# Patient Record
Sex: Female | Born: 1950 | Race: White | Hispanic: No | Smoking: Never smoker
Health system: Southern US, Community
[De-identification: ages and names within clinical notes are randomized; demographics above are authoritative.]

## PROBLEM LIST (undated history)

## (undated) DIAGNOSIS — E785 Hyperlipidemia, unspecified: Secondary | ICD-10-CM

## (undated) DIAGNOSIS — G4700 Insomnia, unspecified: Secondary | ICD-10-CM

## (undated) DIAGNOSIS — K5900 Constipation, unspecified: Secondary | ICD-10-CM

## (undated) DIAGNOSIS — H269 Unspecified cataract: Secondary | ICD-10-CM

## (undated) DIAGNOSIS — R32 Unspecified urinary incontinence: Secondary | ICD-10-CM

## (undated) DIAGNOSIS — R5383 Other fatigue: Secondary | ICD-10-CM

## (undated) DIAGNOSIS — D649 Anemia, unspecified: Secondary | ICD-10-CM

## (undated) DIAGNOSIS — R61 Generalized hyperhidrosis: Secondary | ICD-10-CM

## (undated) DIAGNOSIS — F419 Anxiety disorder, unspecified: Secondary | ICD-10-CM

## (undated) DIAGNOSIS — T7840XA Allergy, unspecified, initial encounter: Secondary | ICD-10-CM

## (undated) HISTORY — DX: Insomnia, unspecified: G47.00

## (undated) HISTORY — DX: Other fatigue: R53.83

## (undated) HISTORY — DX: Anemia, unspecified: D64.9

## (undated) HISTORY — DX: Constipation, unspecified: K59.00

## (undated) HISTORY — DX: Unspecified cataract: H26.9

## (undated) HISTORY — PX: MOUTH SURGERY: SHX715

## (undated) HISTORY — DX: Unspecified urinary incontinence: R32

## (undated) HISTORY — DX: Hyperlipidemia, unspecified: E78.5

## (undated) HISTORY — DX: Generalized hyperhidrosis: R61

## (undated) HISTORY — DX: Anxiety disorder, unspecified: F41.9

## (undated) HISTORY — DX: Allergy, unspecified, initial encounter: T78.40XA

## (undated) HISTORY — PX: TUBAL LIGATION: SHX77

---

## 1999-01-27 ENCOUNTER — Other Ambulatory Visit: Admission: RE | Admit: 1999-01-27 | Discharge: 1999-01-27 | Payer: Self-pay | Admitting: *Deleted

## 2000-02-09 ENCOUNTER — Other Ambulatory Visit: Admission: RE | Admit: 2000-02-09 | Discharge: 2000-02-09 | Payer: Self-pay | Admitting: Obstetrics and Gynecology

## 2001-02-11 ENCOUNTER — Other Ambulatory Visit: Admission: RE | Admit: 2001-02-11 | Discharge: 2001-02-11 | Payer: Self-pay | Admitting: *Deleted

## 2007-12-16 ENCOUNTER — Ambulatory Visit (HOSPITAL_COMMUNITY): Admission: RE | Admit: 2007-12-16 | Discharge: 2007-12-16 | Payer: Self-pay | Admitting: Family Medicine

## 2008-01-05 ENCOUNTER — Ambulatory Visit (HOSPITAL_COMMUNITY): Admission: RE | Admit: 2008-01-05 | Discharge: 2008-01-05 | Payer: Self-pay | Admitting: Family Medicine

## 2009-05-13 ENCOUNTER — Ambulatory Visit (HOSPITAL_COMMUNITY): Admission: RE | Admit: 2009-05-13 | Discharge: 2009-05-13 | Payer: Self-pay | Admitting: Family Medicine

## 2010-06-15 ENCOUNTER — Ambulatory Visit (HOSPITAL_COMMUNITY): Admission: RE | Admit: 2010-06-15 | Discharge: 2010-06-15 | Payer: Self-pay | Admitting: Family Medicine

## 2010-08-17 ENCOUNTER — Ambulatory Visit: Payer: Self-pay | Admitting: Cardiology

## 2010-08-17 DIAGNOSIS — R002 Palpitations: Secondary | ICD-10-CM | POA: Insufficient documentation

## 2010-08-17 DIAGNOSIS — F411 Generalized anxiety disorder: Secondary | ICD-10-CM | POA: Insufficient documentation

## 2010-08-18 ENCOUNTER — Encounter: Payer: Self-pay | Admitting: Cardiology

## 2010-11-21 NOTE — Letter (Signed)
Summary: [facility] MEDICAL RECORDS  [facility]   Imported By: Faythe Ghee 08/18/2010 12:46:19  _____________________________________________________________________  External Attachment:    Type:   Image     Comment:   External Document

## 2010-11-21 NOTE — Assessment & Plan Note (Signed)
Summary: ***NP6 PALPITATIONS   Visit Type:  Initial Consult Primary Provider:  Dr. Assunta Found   History of Present Illness: 60 year old woman referred for cardiology consultation. She presents with a history of long-standing anxiety, reported difficulty handling stressful situations including being "overwhelmed" and also in loud or confusing situations. She denies any long-standing history of palpitations, but describes 3 separate episodes that occurred over the weekend 2 weeks ago. She reports a very stressful, busy time on that Saturday and Sunday, with episodes that began as sweating, followed by nervousness, and then sense of rapid heartbeat. Gradually as she calmed down the symptoms would improve over the span of an hour. She had no dizziness or syncope, no chest pain or shortness of breath. She stated retrospect that she thinks she may have had a "panic attack."  Otherwise with exertion she has no major functional limitation. She reports NYHA class I dyspnea on exertion, no exertional chest pain, no palpitations with exertion, no syncope with exertion.  ECG from today is reviewed below.  Preventive Screening-Counseling & Management  Alcohol-Tobacco     Smoking Status: never  Caffeine-Diet-Exercise     Does Patient Exercise: yes  Current Medications (verified): 1)  Ambien 10 Mg Tabs (Zolpidem Tartrate) .... Take 1 Tab At Bedtime 2)  Zocor 20 Mg Tabs (Simvastatin) .... Take 1 Tab Daily  Allergies (verified): No Known Drug Allergies  Comments:  Nurse/Medical Assistant: patient reviewed meds from Dr.Halls visit stated that meds are correct  Past History:  Family History: Last updated: 08/17/2010 Siblings:4 sisters alive has hypertension Father: hypertension, diabetes, stroke Mother: heart disease  Social History: Last updated: 08/17/2010 Tobacco Use - No Alcohol Use - no Full Time Divorced  2 children Regular Exercise - yes  Past Medical  History: Hyperlipidemia Anxiety  Past Surgical History: Unremarkable  Family History: Siblings:4 sisters alive has hypertension Father: hypertension, diabetes, stroke Mother: heart disease  Social History: Tobacco Use - No Alcohol Use - no Full Time Divorced  2 children Regular Exercise - yes Smoking Status:  never Does Patient Exercise:  yes  Review of Systems  The patient denies anorexia, fever, weight loss, chest pain, syncope, dyspnea on exertion, peripheral edema, prolonged cough, headaches, hemoptysis, abdominal pain, melena, and hematochezia.         Otherwise reviewed and negative.  Vital Signs:  Patient profile:   59 year old female Height:      66 inches Weight:      149 pounds BMI:     24 .14 Pulse rate:   84 / minute BP sitting:   118 / 74  (right arm)  Vitals Entered By: Dreama Saa, CNA (August 17, 2010 8:49 AM)  Physical Exam  Additional Exam:  Normally nourished appearing woman in no acute distress. HEENT: Conjunctiva and lids normal, oropharynx with moist mucosa. Neck: Supple, no elevated JVP or carotid bruits, no thyromegaly. Lungs: Clear to auscultation, nonlabored. Cardiac: Regular rate and rhythm, no pathologic systolic murmur, no click or S3 gallop. Abdomen: Soft, nontender, bowel sounds present. Skin: Warm and dry. Musculoskeletal: No gross deformities. Extremities: No pitting edema, distal pulses full. Neuropsychiatric: Alert and oriented x3, affect grossly appropriate.   EKG  Procedure date:  08/17/2010  Findings:      Normal sinus rhythm at 77 beats per minute, normal intervals.  Impression & Recommendations:  Problem # 1:  PALPITATIONS (ICD-785.1)  Not recurrent over the last 2 weeks, not associated with dizziness or syncope. Otherwise no exertional symptoms. ECG is normal. It may  well be that the symptoms are related to anxiety, in light of her history. The possibility of a transient cardiac dysrhythmia is also to be  considered, however the probability of a major cardiac abnormality is fairly low. We discussed either continued observation with plan to test further if symptoms progress, versus proceeding on with a cardiac monitor and echocardiogram now. She is most comfortable with observation. We discussed avoiding stimulants, also some stress management. She can continue to follow with Dr. Phillips Odor. We can see her back if symptoms escalate.  Problem # 2:  ANXIETY (ICD-300.00)  Seems to be a problem throughout the patient's life based on her description. She states that she has seen a behavioral health specialist in the past and was on a number of different medications, none of which she tolerated by report. It may be worth referring her to see a psychologist for cognitive therapy. Will defer to Dr. Phillips Odor.  Patient Instructions: 1)  Your physician recommends that you schedule a follow-up appointment in: as needed  2)  Your physician recommends that you continue on your current medications as directed. Please refer to the Current Medication list given to you today.

## 2011-10-17 ENCOUNTER — Encounter: Payer: Self-pay | Admitting: Cardiology

## 2013-08-06 ENCOUNTER — Other Ambulatory Visit (HOSPITAL_COMMUNITY): Payer: Self-pay | Admitting: Family Medicine

## 2013-08-06 DIAGNOSIS — Z139 Encounter for screening, unspecified: Secondary | ICD-10-CM

## 2013-08-12 ENCOUNTER — Other Ambulatory Visit (HOSPITAL_COMMUNITY): Payer: Self-pay

## 2015-05-23 ENCOUNTER — Encounter: Payer: Self-pay | Admitting: Gastroenterology

## 2015-07-22 ENCOUNTER — Ambulatory Visit (AMBULATORY_SURGERY_CENTER): Payer: Self-pay

## 2015-07-22 VITALS — Ht 65.0 in | Wt 165.6 lb

## 2015-07-22 DIAGNOSIS — Z8371 Family history of colonic polyps: Secondary | ICD-10-CM

## 2015-07-22 DIAGNOSIS — Z83719 Family history of colon polyps, unspecified: Secondary | ICD-10-CM

## 2015-07-22 MED ORDER — SUPREP BOWEL PREP KIT 17.5-3.13-1.6 GM/177ML PO SOLN: 1.0000 | Freq: Once | ORAL | Status: DC

## 2015-07-22 NOTE — Progress Notes (Signed)
No allergies to eggs or soy No diet/weight loss meds No home oxygen No past problems with anesthesia Very sensitive to anesthesia in dentist office  No internet

## 2015-07-25 NOTE — Patient Instructions (Signed)
Your procedure is scheduled on: 08/02/2015  Report to [facility] at  81  AM.  Call this number if you have problems the morning of surgery: [phone]   Do not eat food or drink liquids :After Midnight.      Take these medicines the morning of surgery with A SIP OF WATER: none   Do not wear jewelry, make-up or nail polish.  Do not wear lotions, powders, or perfumes. You may wear deodorant.  Do not shave 48 hours prior to surgery.  Do not bring valuables to the hospital.  Contacts, dentures or bridgework may not be worn into surgery.  Leave suitcase in the car. After surgery it may be brought to your room.  For patients admitted to the hospital, checkout time is 11:00 AM the day of discharge.   Patients discharged the day of surgery will not be allowed to drive home.  :     Please read over the following fact sheets that you were given: Coughing and Deep Breathing, Surgical Site Infection Prevention, Anesthesia Post-op Instructions and Care and Recovery After Surgery    Cataract A cataract is a clouding of the lens of the eye. When a lens becomes cloudy, vision is reduced based on the degree and nature of the clouding. Many cataracts reduce vision to some degree. Some cataracts make people more near-sighted as they develop. Other cataracts increase glare. Cataracts that are ignored and become worse can sometimes look white. The white color can be seen through the pupil. CAUSES   Aging. However, cataracts may occur at any age, even in newborns.   Certain drugs.   Trauma to the eye.   Certain diseases such as diabetes.   Specific eye diseases such as chronic inflammation inside the eye or a sudden attack of a rare form of glaucoma.   Inherited or acquired medical problems.  SYMPTOMS   Gradual, progressive drop in vision in the affected eye.   Severe, rapid visual loss. This most often happens when trauma is the cause.  DIAGNOSIS  To detect a cataract, an eye doctor examines  the lens. Cataracts are best diagnosed with an exam of the eyes with the pupils enlarged (dilated) by drops.  TREATMENT  For an early cataract, vision may improve by using different eyeglasses or stronger lighting. If that does not help your vision, surgery is the only effective treatment. A cataract needs to be surgically removed when vision loss interferes with your everyday activities, such as driving, reading, or watching TV. A cataract may also have to be removed if it prevents examination or treatment of another eye problem. Surgery removes the cloudy lens and usually replaces it with a substitute lens (intraocular lens, IOL).  At a time when both you and your doctor agree, the cataract will be surgically removed. If you have cataracts in both eyes, only one is usually removed at a time. This allows the operated eye to heal and be out of danger from any possible problems after surgery (such as infection or poor wound healing). In rare cases, a cataract may be doing damage to your eye. In these cases, your caregiver may advise surgical removal right away. The vast majority of people who have cataract surgery have better vision afterward. HOME CARE INSTRUCTIONS  If you are not planning surgery, you may be asked to do the following:  Use different eyeglasses.   Use stronger or brighter lighting.   Ask your eye doctor about reducing your medicine dose or  changing medicines if it is thought that a medicine caused your cataract. Changing medicines does not make the cataract go away on its own.   Become familiar with your surroundings. Poor vision can lead to injury. Avoid bumping into things on the affected side. You are at a higher risk for tripping or falling.   Exercise extreme care when driving or operating machinery.   Wear sunglasses if you are sensitive to bright light or experiencing problems with glare.  SEEK IMMEDIATE MEDICAL CARE IF:   You have a worsening or sudden vision loss.    You notice redness, swelling, or increasing pain in the eye.   You have a fever.  Document Released: 10/08/2005 Document Revised: 09/27/2011 Document Reviewed: 06/01/2011 [facility] Patient Information 2012 [place].PATIENT INSTRUCTIONS POST-ANESTHESIA  IMMEDIATELY FOLLOWING SURGERY:  Do not drive or operate machinery for the first twenty four hours after surgery.  Do not make any important decisions for twenty four hours after surgery or while taking narcotic pain medications or sedatives.  If you develop intractable nausea and vomiting or a severe headache please notify your doctor immediately.  FOLLOW-UP:  Please make an appointment with your surgeon as instructed. You do not need to follow up with anesthesia unless specifically instructed to do so.  WOUND CARE INSTRUCTIONS (if applicable):  Keep a dry clean dressing on the anesthesia/puncture wound site if there is drainage.  Once the wound has quit draining you may leave it open to air.  Generally you should leave the bandage intact for twenty four hours unless there is drainage.  If the epidural site drains for more than 36-48 hours please call the anesthesia department.  QUESTIONS?:  Please feel free to call your physician or the hospital operator if you have any questions, and they will be happy to assist you.

## 2015-07-26 ENCOUNTER — Encounter (HOSPITAL_COMMUNITY)
Admission: RE | Admit: 2015-07-26 | Discharge: 2015-07-26 | Disposition: A | Discharge status: Home / Self Care | Payer: 59 | Source: Ambulatory Visit | Attending: Ophthalmology | Admitting: Ophthalmology

## 2015-07-26 ENCOUNTER — Encounter (HOSPITAL_COMMUNITY): Payer: Self-pay

## 2015-07-26 ENCOUNTER — Other Ambulatory Visit: Payer: Self-pay

## 2015-07-26 DIAGNOSIS — Z01818 Encounter for other preprocedural examination: Secondary | ICD-10-CM | POA: Insufficient documentation

## 2015-07-26 DIAGNOSIS — H2512 Age-related nuclear cataract, left eye: Secondary | ICD-10-CM | POA: Insufficient documentation

## 2015-07-26 LAB — BASIC METABOLIC PANEL
Anion gap: 8 (ref 5–15)
BUN: 13 mg/dL (ref 6–20)
CO2: 26 mmol/L (ref 22–32)
Calcium: 8.7 mg/dL — ABNORMAL LOW (ref 8.9–10.3)
Chloride: 107 mmol/L (ref 101–111)
Creatinine, Ser: 0.82 mg/dL (ref 0.44–1.00)
GFR calc Af Amer: >60 mL/min (ref >60)
GFR calc non Af Amer: >60 mL/min (ref >60)
Glucose, Bld: 88 mg/dL (ref 65–99)
Potassium: 4.2 mmol/L (ref 3.5–5.1)
Sodium: 141 mmol/L (ref 135–145)

## 2015-07-26 LAB — CBC WITH DIFFERENTIAL/PLATELET
Basophils Absolute: 0.1 10*3/uL (ref 0.0–0.1)
Basophils Relative: 1 %
Eosinophils Absolute: 0.1 10*3/uL (ref 0.0–0.7)
Eosinophils Relative: 1 %
HCT: 41.3 % (ref 36.0–46.0)
Hemoglobin: 14 g/dL (ref 12.0–15.0)
Lymphocytes Relative: 32 %
Lymphs Abs: 2.1 10*3/uL (ref 0.7–4.0)
MCH: 30 pg (ref 26.0–34.0)
MCHC: 33.9 g/dL (ref 30.0–36.0)
MCV: 88.6 fL (ref 78.0–100.0)
Monocytes Absolute: 0.6 10*3/uL (ref 0.1–1.0)
Monocytes Relative: 9 %
Neutro Abs: 3.8 10*3/uL (ref 1.7–7.7)
Neutrophils Relative %: 57 %
Platelets: 273 10*3/uL (ref 150–400)
RBC: 4.66 MIL/uL (ref 3.87–5.11)
RDW: 13.6 % (ref 11.5–15.5)
WBC: 6.6 10*3/uL (ref 4.0–10.5)

## 2015-07-26 NOTE — Pre-Procedure Instructions (Signed)
Patient given inforamtion to sign up for my chart at home. 

## 2015-07-28 ENCOUNTER — Other Ambulatory Visit: Payer: Self-pay | Admitting: Obstetrics and Gynecology

## 2015-07-28 DIAGNOSIS — R928 Other abnormal and inconclusive findings on diagnostic imaging of breast: Secondary | ICD-10-CM

## 2015-08-01 MED ORDER — KETOROLAC TROMETHAMINE 0.5 % OP SOLN
OPHTHALMIC | Status: AC
  Filled 2015-08-01: qty 5

## 2015-08-01 MED ORDER — CYCLOPENTOLATE-PHENYLEPHRINE OP SOLN OPTIME - NO CHARGE
OPHTHALMIC | Status: AC
  Filled 2015-08-01: qty 2

## 2015-08-01 MED ORDER — PHENYLEPHRINE HCL 2.5 % OP SOLN
OPHTHALMIC | Status: AC
  Filled 2015-08-01: qty 15

## 2015-08-01 MED ORDER — TETRACAINE HCL 0.5 % OP SOLN
OPHTHALMIC | Status: AC
  Filled 2015-08-01: qty 2

## 2015-08-02 ENCOUNTER — Ambulatory Visit (HOSPITAL_COMMUNITY): Payer: 59 | Admitting: Anesthesiology

## 2015-08-02 ENCOUNTER — Encounter (HOSPITAL_COMMUNITY)
Admission: RE | Disposition: A | Discharge status: Home / Self Care | Payer: Self-pay | Source: Ambulatory Visit | Attending: Ophthalmology

## 2015-08-02 ENCOUNTER — Ambulatory Visit (HOSPITAL_COMMUNITY)
Admission: RE | Admit: 2015-08-02 | Discharge: 2015-08-02 | Disposition: A | Discharge status: Home / Self Care | Payer: 59 | Source: Ambulatory Visit | Attending: Ophthalmology | Admitting: Ophthalmology

## 2015-08-02 ENCOUNTER — Encounter (HOSPITAL_COMMUNITY): Payer: Self-pay

## 2015-08-02 DIAGNOSIS — H2512 Age-related nuclear cataract, left eye: Secondary | ICD-10-CM | POA: Insufficient documentation

## 2015-08-02 DIAGNOSIS — H25012 Cortical age-related cataract, left eye: Secondary | ICD-10-CM | POA: Insufficient documentation

## 2015-08-02 HISTORY — PX: CATARACT EXTRACTION W/PHACO: SHX586

## 2015-08-02 SURGERY — PHACOEMULSIFICATION, CATARACT, WITH IOL INSERTION
Anesthesia: Monitor Anesthesia Care | Site: Eye | Laterality: Left

## 2015-08-02 MED ORDER — KETOROLAC TROMETHAMINE 0.5 % OP SOLN
1.0000 [drp] | OPHTHALMIC | Status: AC
  Administered 2015-08-02 (×3): 1 [drp] via OPHTHALMIC

## 2015-08-02 MED ORDER — FENTANYL CITRATE (PF) 100 MCG/2ML IJ SOLN
25.0000 ug | INTRAMUSCULAR | Status: AC
Start: 2015-08-02 — End: 2015-08-02
  Administered 2015-08-02 (×2): 25 ug via INTRAVENOUS

## 2015-08-02 MED ORDER — MIDAZOLAM HCL 2 MG/2ML IJ SOLN
1.0000 mg | INTRAMUSCULAR | Status: DC | PRN
  Administered 2015-08-02: 2 mg via INTRAVENOUS

## 2015-08-02 MED ORDER — LACTATED RINGERS IV SOLN
INTRAVENOUS | Status: DC
  Administered 2015-08-02: 09:00:00 via INTRAVENOUS

## 2015-08-02 MED ORDER — EPINEPHRINE HCL 1 MG/ML IJ SOLN
INTRAMUSCULAR | Status: AC
  Filled 2015-08-02: qty 1

## 2015-08-02 MED ORDER — TETRACAINE 0.5 % OP SOLN OPTIME - NO CHARGE
OPHTHALMIC | Status: DC | PRN
  Administered 2015-08-02: 2 [drp] via OPHTHALMIC

## 2015-08-02 MED ORDER — TETRACAINE HCL 0.5 % OP SOLN
1.0000 [drp] | OPHTHALMIC | Status: AC
Start: 2015-08-02 — End: 2015-08-02
  Administered 2015-08-02 (×3): 1 [drp] via OPHTHALMIC

## 2015-08-02 MED ORDER — PHENYLEPHRINE HCL 2.5 % OP SOLN
1.0000 [drp] | OPHTHALMIC | Status: AC
  Administered 2015-08-02 (×3): 1 [drp] via OPHTHALMIC

## 2015-08-02 MED ORDER — PROVISC 10 MG/ML IO SOLN
INTRAOCULAR | Status: DC | PRN
  Administered 2015-08-02: 0.85 mL via INTRAOCULAR

## 2015-08-02 MED ORDER — CYCLOPENTOLATE-PHENYLEPHRINE 0.2-1 % OP SOLN
1.0000 [drp] | OPHTHALMIC | Status: AC
  Administered 2015-08-02 (×3): 1 [drp] via OPHTHALMIC

## 2015-08-02 MED ORDER — MIDAZOLAM HCL 2 MG/2ML IJ SOLN
INTRAMUSCULAR | Status: AC
Start: 2015-08-02 — End: 2015-08-02
  Filled 2015-08-02: qty 2

## 2015-08-02 MED ORDER — FENTANYL CITRATE (PF) 100 MCG/2ML IJ SOLN
INTRAMUSCULAR | Status: AC
  Filled 2015-08-02: qty 2

## 2015-08-02 MED ORDER — BSS IO SOLN
INTRAOCULAR | Status: DC | PRN
  Administered 2015-08-02: 500 mL

## 2015-08-02 MED ORDER — BSS IO SOLN
INTRAOCULAR | Status: DC | PRN
  Administered 2015-08-02: 15 mL

## 2015-08-02 SURGICAL SUPPLY — 11 items
CLOTH BEACON ORANGE TIMEOUT ST (SAFETY) ×3 IMPLANT
EYE SHIELD UNIVERSAL CLEAR (GAUZE/BANDAGES/DRESSINGS) ×3 IMPLANT
GLOVE BIO SURGEON STRL SZ 6.5 (GLOVE) ×2 IMPLANT
GLOVE BIO SURGEONS STRL SZ 6.5 (GLOVE) ×1
GLOVE EXAM NITRILE MD LF STRL (GLOVE) ×3 IMPLANT
LENS ALC ACRYL/TECN (Ophthalmic Related) ×3 IMPLANT
PAD ARMBOARD 7.5X6 YLW CONV (MISCELLANEOUS) ×3 IMPLANT
RING MALYGIN (MISCELLANEOUS) ×3 IMPLANT
TAPE SURG TRANSPORE 1 IN (GAUZE/BANDAGES/DRESSINGS) ×1 IMPLANT
TAPE SURGICAL TRANSPORE 1 IN (GAUZE/BANDAGES/DRESSINGS) ×2
WATER STERILE IRR 250ML POUR (IV SOLUTION) ×3 IMPLANT

## 2015-08-02 NOTE — Transfer of Care (Signed)
Immediate Anesthesia Transfer of Care Note  Patient: Yesenia Sanchez  Procedure(s) Performed: Procedure(s) (LRB): CATARACT EXTRACTION PHACO AND INTRAOCULAR LENS PLACEMENT LEFT EYE (Left)  Patient Location: Shortstay  Anesthesia Type: MAC  Level of Consciousness: awake  Airway & Oxygen Therapy: Patient Spontanous Breathing   Post-op Assessment: Report given to PACU RN, Post -op Vital signs reviewed and stable and Patient moving all extremities  Post vital signs: Reviewed and stable  Complications: No apparent anesthesia complications

## 2015-08-02 NOTE — Anesthesia Preprocedure Evaluation (Signed)
Anesthesia Evaluation  Patient identified by MRN, date of birth, ID band Patient awake    Reviewed: Allergy & Precautions, NPO status , Patient's Chart, lab work & pertinent test results  Airway Mallampati: II  TM Distance: >3 FB     Dental  (+) Teeth Intact   Pulmonary neg pulmonary ROS,    breath sounds clear to auscultation       Cardiovascular negative cardio ROS   Rhythm:Regular Rate:Normal     Neuro/Psych Anxiety    GI/Hepatic negative GI ROS,   Endo/Other    Renal/GU      Musculoskeletal   Abdominal   Peds  Hematology   Anesthesia Other Findings   Reproductive/Obstetrics                             Anesthesia Physical Anesthesia Plan  ASA: I  Anesthesia Plan: MAC   Post-op Pain Management:    Induction: Intravenous  Airway Management Planned: Nasal Cannula  Additional Equipment:   Intra-op Plan:   Post-operative Plan:   Informed Consent: I have reviewed the patients History and Physical, chart, labs and discussed the procedure including the risks, benefits and alternatives for the proposed anesthesia with the patient or authorized representative who has indicated his/her understanding and acceptance.     Plan Discussed with:   Anesthesia Plan Comments:         Anesthesia Quick Evaluation

## 2015-08-02 NOTE — Discharge Instructions (Signed)
Yesenia Sanchez  08/02/2015           [facility] Instructions [place] [address]-[place]      1. Avoid closing eyes tightly. One often closes the eye tightly when laughing, talking, sneezing, coughing or if they feel irritated. At these times, you should be careful not to close your eyes tightly.  2. Instill eye drops as instructed. To instill drops in your eye, open it, look up and have someone gently pull the lower lid down and instill a couple of drops inside the lower lid.  3. Do not touch upper lid.  4. Take Advil or Tylenol for pain.  5. You may use either eye for near work, such as reading or sewing and you may watch television.  6. You may have your hair done at the beauty parlor at any time.  7. Wear dark glasses with or without your own glasses if you are in bright light.  8. Call our office at [phone] or [phone] if you have sharp pain in your eye or unusual symptoms.  9. Do not be concerned because vision in the operative eye is not good. It will not be good, no matter how successful the operation, until you get a special lens for it. Your old glasses will not be suited to the new eye that was operated on and you will not be ready for a new lens for about a month.  10. Follow up at the [facility] office.    I have received a copy of the above instructions and will follow them.      FOLLOW UP WITH DR. SHAPIRO TODAY BETWEEN 2-3 PM

## 2015-08-02 NOTE — Op Note (Signed)
Patient brought to the operating room and prepped and draped in the usual manner.  Lid speculum inserted in left eye.  Stab incision made at the twelve o'clock position.  Provisc instilled in the anterior chamber.   A 2.4 mm. Stab incision was made temporally. Due to a small pupil, a Malugyn Ring was inserted. An anterior capsulotomy was done with a bent 25 gauge needle.  The nucleus was hydrodissected.  The Phaco tip was inserted in the anterior chamber and the nucleus was emulsified.  CDE was 4.01.  The cortical material was then removed with the I and A tip.  Posterior capsule was the polished.  The anterior chamber was deepened with Provisc.  A 22.0 Diopter Alcon [id number] IOL was then inserted in the capsular bag.  The Malugyn Ring was removed. Provisc was then removed with the I and A tip.  The wound was then hydrated.  Patient sent to the Recovery Room in good condition with follow up in my office.  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

## 2015-08-02 NOTE — Anesthesia Postprocedure Evaluation (Signed)
  Anesthesia Post-op Note  Patient: Yesenia Sanchez  Procedure(s) Performed: Procedure(s) (LRB): CATARACT EXTRACTION PHACO AND INTRAOCULAR LENS PLACEMENT LEFT EYE (Left)  Patient Location:  Short Stay  Anesthesia Type: MAC  Level of Consciousness: awake  Airway and Oxygen Therapy: Patient Spontanous Breathing  Post-op Pain: none  Post-op Assessment: Post-op Vital signs reviewed, Patient's Cardiovascular Status Stable, Respiratory Function Stable, Patent Airway, No signs of Nausea or vomiting and Pain level controlled  Post-op Vital Signs: Reviewed and stable  Complications: No apparent anesthesia complications

## 2015-08-02 NOTE — H&P (Signed)
The patient was re examined and there is no change in the patients condition since the original H and P. 

## 2015-08-02 NOTE — Anesthesia Procedure Notes (Signed)
Procedure Name: MAC Date/Time: 08/02/2015 9:28 AM Performed by: Vista Deck Pre-anesthesia Checklist: Patient identified, Emergency Drugs available, Suction available, Timeout performed and Patient being monitored Patient Re-evaluated:Patient Re-evaluated prior to inductionOxygen Delivery Method: Nasal Cannula

## 2015-08-04 ENCOUNTER — Encounter (HOSPITAL_COMMUNITY): Payer: Self-pay | Admitting: Ophthalmology

## 2015-08-04 ENCOUNTER — Ambulatory Visit (AMBULATORY_SURGERY_CENTER): Payer: 59 | Admitting: Gastroenterology

## 2015-08-04 VITALS — BP 130/60 | HR 66 | Temp 98.0°F | Resp 18 | Ht 65.0 in | Wt 165.0 lb

## 2015-08-04 DIAGNOSIS — D120 Benign neoplasm of cecum: Secondary | ICD-10-CM

## 2015-08-04 DIAGNOSIS — K635 Polyp of colon: Secondary | ICD-10-CM | POA: Diagnosis not present

## 2015-08-04 DIAGNOSIS — Z1211 Encounter for screening for malignant neoplasm of colon: Secondary | ICD-10-CM

## 2015-08-04 DIAGNOSIS — D125 Benign neoplasm of sigmoid colon: Secondary | ICD-10-CM

## 2015-08-04 DIAGNOSIS — Z8371 Family history of colonic polyps: Secondary | ICD-10-CM

## 2015-08-04 MED ORDER — SODIUM CHLORIDE 0.9 % IV SOLN: 500.0000 mL | INTRAVENOUS | Status: DC

## 2015-08-04 NOTE — Patient Instructions (Signed)
YOU HAD AN ENDOSCOPIC PROCEDURE TODAY AT THE [facility]:   Refer to the procedure report that was given to you for any specific questions about what was found during the examination.  If the procedure report does not answer your questions, please call your gastroenterologist to clarify.  If you requested that your care partner not be given the details of your procedure findings, then the procedure report has been included in a sealed envelope for you to review at your convenience later.  YOU SHOULD EXPECT: Some feelings of bloating in the abdomen. Passage of more gas than usual.  Walking can help get rid of the air that was put into your GI tract during the procedure and reduce the bloating. If you had a lower endoscopy (such as a colonoscopy or flexible sigmoidoscopy) you may notice spotting of blood in your stool or on the toilet paper. If you underwent a bowel prep for your procedure, you may not have a normal bowel movement for a few days.  Please Note:  You might notice some irritation and congestion in your nose or some drainage.  This is from the oxygen used during your procedure.  There is no need for concern and it should clear up in a day or so.  SYMPTOMS TO REPORT IMMEDIATELY:   Following lower endoscopy (colonoscopy or flexible sigmoidoscopy):  Excessive amounts of blood in the stool  Significant tenderness or worsening of abdominal pains  Swelling of the abdomen that is new, acute  Fever of 100F or higher   For urgent or emergent issues, a gastroenterologist can be reached at any hour by calling [phone].   DIET: Your first meal following the procedure should be a small meal and then it is ok to progress to your normal diet. Heavy or fried foods are harder to digest and may make you feel nauseous or bloated.  Likewise, meals heavy in dairy and vegetables can increase bloating.  Drink plenty of fluids but you should avoid alcoholic beverages for 24  hours.  ACTIVITY:  You should plan to take it easy for the rest of today and you should NOT DRIVE or use heavy machinery until tomorrow (because of the sedation medicines used during the test).    FOLLOW UP: Our staff will call the number listed on your records the next business day following your procedure to check on you and address any questions or concerns that you may have regarding the information given to you following your procedure. If we do not reach you, we will leave a message.  However, if you are feeling well and you are not experiencing any problems, there is no need to return our call.  We will assume that you have returned to your regular daily activities without incident.  If any biopsies were taken you will be contacted by phone or by letter within the next 1-3 weeks.  Please call us at [phone] if you have not heard about the biopsies in 3 weeks.    SIGNATURES/CONFIDENTIALITY: You and/or your care partner have signed paperwork which will be entered into your electronic medical record.  These signatures attest to the fact that that the information above on your After Visit Summary has been reviewed and is understood.  Full responsibility of the confidentiality of this discharge information lies with you and/or your care-partner.   Resume medications. Information given on polyps,hemorrhoids and high fiber diet. 

## 2015-08-04 NOTE — Progress Notes (Signed)
To recovery, report to Brown, RN, VSS. 

## 2015-08-04 NOTE — Op Note (Signed)
[place]  [address] [place], [address]   COLONOSCOPY PROCEDURE REPORT  PATIENT: [name], [name]  MR#: [id number] BIRTHDATE: 10-02-1951 , 26  yrs. old GENDER: female ENDOSCOPIST: Ladene Artist, MD, [facility] REFERRED GY:FVCB Hilma Favors, M.D. PROCEDURE DATE:  08/04/2015 PROCEDURE:   Colonoscopy, screening and Colonoscopy with biopsy First Screening Colonoscopy - Avg.  risk and is 50 yrs.  old or older Yes.  Prior Negative Screening - Now for repeat screening. N/A  History of Adenoma - Now for follow-up colonoscopy & has been > or = to 3 yrs.  N/A  Polyps removed today? Yes ASA CLASS:   Class II INDICATIONS:Screening for colonic neoplasia and FH Colon Adenoma. Sister with adenomatous polyp at age 65. MEDICATIONS: Monitored anesthesia care and Propofol 200 mg IV DESCRIPTION OF PROCEDURE:   After the risks benefits and alternatives of the procedure were thoroughly explained, informed consent was obtained.  The digital rectal exam revealed no abnormalities of the rectum.   The LB [id number]  endoscope was introduced through the anus and advanced to the cecum, which was identified by both the appendix and ileocecal valve. No adverse events experienced.   The quality of the prep was good.  (Suprep was used)  The instrument was then slowly withdrawn as the colon was fully examined. Estimated blood loss is zero unless otherwise noted in this procedure report.    COLON FINDINGS: Two sessile polyps measuring 4-5 mm in size were found in the sigmoid colon and at the cecum.  Polypectomies were performed with cold forceps.  The resection was complete, the polyp tissue was completely retrieved and sent to histology.   The examination was otherwise normal.  Retroflexed views revealed internal Grade I hemorrhoids. The time to cecum = 3.5 Withdrawal time = 10.2   The scope was withdrawn and the procedure completed. COMPLICATIONS: There were no immediate  complications.  ENDOSCOPIC IMPRESSION: 1.   Two sessile polyps in the sigmoid colon and at the cecum; polypectomies performed with cold forceps 2.   Grade l internal hemorrhoids  RECOMMENDATIONS: 1.  Await pathology results 2.  Repeat colonoscopy in 5 years if polyp(s) adenomatous; otherwise 10 years  eSigned:  Ladene Artist, MD, [facility] 08/04/2015 11:52 AM

## 2015-08-04 NOTE — Progress Notes (Signed)
Called to room to assist during endoscopic procedure.  Patient ID and intended procedure confirmed with present staff. Received instructions for my participation in the procedure from the performing physician.  

## 2015-08-04 NOTE — Progress Notes (Signed)
Pt just had left cateract removed Tuesday 08-02-15.  Also pt reported having a few areas of poison oak on her upper right arm and at the bend of her left arm. maw

## 2015-08-05 ENCOUNTER — Telehealth: Payer: Self-pay

## 2015-08-05 NOTE — Telephone Encounter (Signed)
  Follow up Call-  Call back number 08/04/2015  Post procedure Call Back phone  # [phone] cell  Permission to leave phone message Yes     Patient questions:  Do you have a fever, pain , or abdominal swelling? No. Pain Score  0 *  Have you tolerated food without any problems? Yes.    Have you been able to return to your normal activities? Yes.    Do you have any questions about your discharge instructions: Diet   No. Medications  No. Follow up visit  No.  Do you have questions or concerns about your Care? No.  Actions: * If pain score is 4 or above: No action needed, pain <4.

## 2015-08-08 NOTE — Progress Notes (Signed)
Attempted to complete pre op telephone call, however patient states that she may not have right eye done at this time and asked if I would call her after her appointment with Dr Gershon Crane tomorrow between 12-230 and verify wether she will be following through with right cataract removal.

## 2015-08-09 ENCOUNTER — Encounter: Payer: Self-pay | Admitting: Gastroenterology

## 2015-08-09 ENCOUNTER — Encounter (HOSPITAL_COMMUNITY): Payer: 59

## 2015-08-09 NOTE — Pre-Procedure Instructions (Signed)
Contacted patient, who states that she is not going to proceed with second cataract surgery at this point.  [place] notified.  Office aware.

## 2015-08-12 ENCOUNTER — Ambulatory Visit
Admission: RE | Admit: 2015-08-12 | Discharge: 2015-08-12 | Disposition: A | Discharge status: Home / Self Care | Payer: 59 | Source: Ambulatory Visit | Attending: Obstetrics and Gynecology | Admitting: Obstetrics and Gynecology

## 2015-08-12 DIAGNOSIS — R928 Other abnormal and inconclusive findings on diagnostic imaging of breast: Secondary | ICD-10-CM

## 2015-08-16 ENCOUNTER — Ambulatory Visit (HOSPITAL_COMMUNITY): Admission: RE | Admit: 2015-08-16 | Payer: 59 | Source: Ambulatory Visit | Admitting: Ophthalmology

## 2015-08-16 ENCOUNTER — Encounter (HOSPITAL_COMMUNITY): Admission: RE | Payer: Self-pay | Source: Ambulatory Visit

## 2015-08-16 SURGERY — PHACOEMULSIFICATION, CATARACT, WITH IOL INSERTION
Anesthesia: Monitor Anesthesia Care | Laterality: Right

## 2016-03-14 ENCOUNTER — Ambulatory Visit (INDEPENDENT_AMBULATORY_CARE_PROVIDER_SITE_OTHER): Payer: BLUE CROSS/BLUE SHIELD | Admitting: Cardiovascular Disease

## 2016-03-14 ENCOUNTER — Encounter: Payer: Self-pay | Admitting: Cardiovascular Disease

## 2016-03-14 VITALS — BP 140/80 | HR 87 | Ht 65.0 in | Wt 172.0 lb

## 2016-03-14 DIAGNOSIS — L819 Disorder of pigmentation, unspecified: Secondary | ICD-10-CM | POA: Diagnosis not present

## 2016-03-14 DIAGNOSIS — R030 Elevated blood-pressure reading, without diagnosis of hypertension: Secondary | ICD-10-CM

## 2016-03-14 DIAGNOSIS — IMO0001 Reserved for inherently not codable concepts without codable children: Secondary | ICD-10-CM

## 2016-03-14 DIAGNOSIS — R002 Palpitations: Secondary | ICD-10-CM | POA: Diagnosis not present

## 2016-03-14 DIAGNOSIS — E7800 Pure hypercholesterolemia, unspecified: Secondary | ICD-10-CM | POA: Diagnosis not present

## 2016-03-14 NOTE — Progress Notes (Signed)
Patient ID: Yesenia Sanchez, female   DOB: [birth year], 65 y.o.   MRN: [id number]       CARDIOLOGY CONSULT NOTE  Patient ID: Yesenia Sanchez MRN: [id number] DOB/AGE: Apr 29, 1951 65 y.o.  Admit date: (Not on file) Primary Physician: Purvis Kilts, MD Referring Physician: Hilma Favors MD  Reason for Consultation: palpitations  HPI: The patient is a 65 year old woman with a history of chronic anxiety who has been referred for the evaluation of palpitations.   Labs dated 02/15/16 showed hemoglobin 13.9, platelets 335, HbA1c 5.7%, total cholesterol 254, triglycerides 158, HDL 47, LDL 175, TSH 0.9.  ECG performed in the office today which I personally interpreted demonstrated sinus rhythm with inferior Q waves and no arrhythmias.  She tells me after starting locations for anxiety her palpitations have resolved. She denies chest pain, shortness of breath, orthopnea, and leg swelling.  Her complaint today is that her thighs turn red in the shower and then turn white when she presses on them. She denies claudication pain. There is no history of tobacco use.  She has been taking simvastatin for one or two weeks and has read several articles online about the dangers of statin therapy.  Allergies  Allergen Reactions  . Codeine Hives  . Sulfa Antibiotics Hives    Current Outpatient Prescriptions  Medication Sig Dispense Refill  . Coenzyme Q10 (COQ10 PO) Take 1 tablet by mouth daily.    Marland Kitchen FLUoxetine (PROZAC) 10 MG capsule Take 10 mg by mouth 3 (three) times daily.    Marland Kitchen ibuprofen (ADVIL,MOTRIN) 200 MG tablet Take 400 mg by mouth every 6 (six) hours as needed for moderate pain.    . simvastatin (ZOCOR) 10 MG tablet Take 10 mg by mouth daily.     No current facility-administered medications for this visit.    Past Medical History  Diagnosis Date  . Hyperlipidemia   . Anxiety   . Allergy   . Fatigue   . Night sweats   . Insomnia   . Urinary incontinence   . Hyperlipidemia   .  Constipation   . Anemia     in my 20's  . Cataract     right eye, left eye cateract was removed    Past Surgical History  Procedure Laterality Date  . Mouth surgery    . Tubal ligation    . Cataract extraction w/phaco Left 08/02/2015    Procedure: CATARACT EXTRACTION PHACO AND INTRAOCULAR LENS PLACEMENT LEFT EYE;  Surgeon: Rutherford Guys, MD;  Location: [facility];  Service: Ophthalmology;  Laterality: Left;  CDE:4.01    Social History   Social History  . Marital Status: Divorced    Spouse Name: N/A  . Number of Children: N/A  . Years of Education: N/A   Occupational History  . Full time    Social History Main Topics  . Smoking status: Never Smoker   . Smokeless tobacco: Never Used  . Alcohol Use: No  . Drug Use: No  . Sexual Activity: No   Other Topics Concern  . Not on file   Social History Narrative   Divorced   2 children   Exercises regularly     No family history of premature CAD in 1st degree relatives.  Prior to Admission medications   Medication Sig Start Date End Date Taking? Authorizing Provider  Coenzyme Q10 (COQ10 PO) Take 1 tablet by mouth daily.    Historical Provider, MD  fluorometholone (FML) 0.1 % ophthalmic suspension PLACE 1 DROP OD QID  FOR 5 DAYS 07/28/15   Historical Provider, MD  ibuprofen (ADVIL,MOTRIN) 200 MG tablet Take 400 mg by mouth every 6 (six) hours as needed for moderate pain.    Historical Provider, MD  ofloxacin (OCUFLOX) 0.3 % ophthalmic solution PLACE 1 DROP BID INTO THE OPERATIVE EYE UTD 07/27/15   Historical Provider, MD  PROLENSA 0.07 % SOLN  07/27/15   Historical Provider, MD     Review of systems complete and found to be negative unless listed above in HPI     Physical exam Blood pressure 140/80, pulse 87, height 5\' 5"  (1.651 m), weight 172 lb (78.019 kg), SpO2 97 %. General: NAD Neck: No JVD, no thyromegaly or thyroid nodule.  Lungs: Clear to auscultation bilaterally with normal respiratory effort. CV: Nondisplaced  PMI. Regular rate and rhythm, normal S1/S2, no S3/S4, no murmur.  No peripheral edema.  No carotid bruit.  Normal pedal pulses.  Abdomen: Soft, nontender, no hepatosplenomegaly, no distention.  Skin: Intact without lesions or rashes.  Neurologic: Alert and oriented x 3.  Psych: Normal affect. Extremities: No clubbing or cyanosis.  HEENT: Normal.   ECG: Most recent ECG reviewed.  Labs:   Lab Results  Component Value Date   WBC 6.6 07/26/2015   HGB 14.0 07/26/2015   HCT 41.3 07/26/2015   MCV 88.6 07/26/2015   PLT 273 07/26/2015   No results for input(s): NA, K, CL, CO2, BUN, CREATININE, CALCIUM, PROT, BILITOT, ALKPHOS, ALT, AST, GLUCOSE in the last 168 hours.  Invalid input(s): LABALBU No results found for: CKTOTAL, CKMB, CKMBINDEX, TROPONINI No results found for: CHOL No results found for: HDL No results found for: LDLCALC No results found for: TRIG No results found for: CHOLHDL No results found for: LDLDIRECT       Studies: No results found.  ASSESSMENT AND PLAN:  1. Palpitations: Resolved with treatment of anxiety.  2. Hyperlipidemia: 65yr ASCVD risk is 9.4%, thus warrants statin therapy. I explained to her the importance of primary prevention of CAD. She has agreed to continue simvastatin 10 mg. I suspect this dose will have to be increased in the future.  3. Essential HTN: BP 140/80 today, 160/90 at PCP's office. Needs additional monitoring as she likely warrants antihypertensive therapy. She attributes high BP to anxiety.  4. Leg discoloration: Normal lower extremity pulses. May be due to histamine reaction.  Dispo: fu prn.   Signed: Kate Sable, M.D., F.A.C.C.  03/14/2016, 1:52 PM

## 2016-03-14 NOTE — Patient Instructions (Signed)
Medication: Your physician recommends that you continue on your current medications as directed. Please refer to the Current Medication list given to you today.    Labwork: NONE  Testing/Procedures: NONE   Follow-Up: Your physician recommends that you schedule a follow-up appointment in: AS NEEDED    Any Other Special Instructions Will Be Listed Below (If Applicable).     If you need a refill on your cardiac medications before your next appointment, please call your pharmacy.

## 2016-08-23 ENCOUNTER — Other Ambulatory Visit (HOSPITAL_COMMUNITY): Payer: Self-pay | Admitting: Registered Nurse

## 2016-08-24 ENCOUNTER — Other Ambulatory Visit (HOSPITAL_COMMUNITY): Payer: Self-pay | Admitting: Registered Nurse

## 2016-08-27 ENCOUNTER — Other Ambulatory Visit (HOSPITAL_COMMUNITY): Payer: Self-pay | Admitting: Registered Nurse

## 2016-08-28 ENCOUNTER — Other Ambulatory Visit (HOSPITAL_COMMUNITY): Payer: Self-pay | Admitting: Registered Nurse

## 2016-08-28 DIAGNOSIS — M79604 Pain in right leg: Secondary | ICD-10-CM

## 2016-08-28 DIAGNOSIS — M79605 Pain in left leg: Principal | ICD-10-CM

## 2016-09-10 DIAGNOSIS — H353222 Exudative age-related macular degeneration, left eye, with inactive choroidal neovascularization: Secondary | ICD-10-CM | POA: Diagnosis not present

## 2016-09-10 DIAGNOSIS — H35361 Drusen (degenerative) of macula, right eye: Secondary | ICD-10-CM | POA: Diagnosis not present

## 2016-09-10 DIAGNOSIS — H35712 Central serous chorioretinopathy, left eye: Secondary | ICD-10-CM | POA: Diagnosis not present

## 2016-09-10 DIAGNOSIS — H43822 Vitreomacular adhesion, left eye: Secondary | ICD-10-CM | POA: Diagnosis not present

## 2016-09-10 DIAGNOSIS — H35362 Drusen (degenerative) of macula, left eye: Secondary | ICD-10-CM | POA: Diagnosis not present

## 2016-09-20 DIAGNOSIS — H35712 Central serous chorioretinopathy, left eye: Secondary | ICD-10-CM | POA: Diagnosis not present

## 2016-09-20 DIAGNOSIS — H353222 Exudative age-related macular degeneration, left eye, with inactive choroidal neovascularization: Secondary | ICD-10-CM | POA: Diagnosis not present

## 2016-09-20 DIAGNOSIS — H35362 Drusen (degenerative) of macula, left eye: Secondary | ICD-10-CM | POA: Diagnosis not present

## 2016-09-20 DIAGNOSIS — H43822 Vitreomacular adhesion, left eye: Secondary | ICD-10-CM | POA: Diagnosis not present

## 2016-10-26 DIAGNOSIS — N952 Postmenopausal atrophic vaginitis: Secondary | ICD-10-CM | POA: Diagnosis not present

## 2016-10-26 DIAGNOSIS — Z6829 Body mass index (BMI) 29.0-29.9, adult: Secondary | ICD-10-CM | POA: Diagnosis not present

## 2016-10-26 DIAGNOSIS — R32 Unspecified urinary incontinence: Secondary | ICD-10-CM | POA: Diagnosis not present

## 2016-10-26 DIAGNOSIS — Z01419 Encounter for gynecological examination (general) (routine) without abnormal findings: Secondary | ICD-10-CM | POA: Diagnosis not present

## 2016-10-26 DIAGNOSIS — Z1231 Encounter for screening mammogram for malignant neoplasm of breast: Secondary | ICD-10-CM | POA: Diagnosis not present

## 2016-11-15 DIAGNOSIS — H353222 Exudative age-related macular degeneration, left eye, with inactive choroidal neovascularization: Secondary | ICD-10-CM | POA: Diagnosis not present

## 2016-11-15 DIAGNOSIS — H35712 Central serous chorioretinopathy, left eye: Secondary | ICD-10-CM | POA: Diagnosis not present

## 2016-11-15 DIAGNOSIS — H43822 Vitreomacular adhesion, left eye: Secondary | ICD-10-CM | POA: Diagnosis not present

## 2016-11-15 DIAGNOSIS — H35362 Drusen (degenerative) of macula, left eye: Secondary | ICD-10-CM | POA: Diagnosis not present

## 2017-01-30 DIAGNOSIS — S46002S Unspecified injury of muscle(s) and tendon(s) of the rotator cuff of left shoulder, sequela: Secondary | ICD-10-CM | POA: Diagnosis not present

## 2017-01-30 DIAGNOSIS — Z1389 Encounter for screening for other disorder: Secondary | ICD-10-CM | POA: Diagnosis not present

## 2017-01-30 DIAGNOSIS — Z6828 Body mass index (BMI) 28.0-28.9, adult: Secondary | ICD-10-CM | POA: Diagnosis not present

## 2017-02-15 ENCOUNTER — Other Ambulatory Visit (HOSPITAL_COMMUNITY): Payer: Self-pay | Admitting: Family Medicine

## 2017-02-18 ENCOUNTER — Other Ambulatory Visit (HOSPITAL_COMMUNITY): Payer: Self-pay | Admitting: Family Medicine

## 2017-02-18 DIAGNOSIS — M7550 Bursitis of unspecified shoulder: Principal | ICD-10-CM

## 2017-02-18 DIAGNOSIS — M719 Bursopathy, unspecified: Secondary | ICD-10-CM

## 2017-03-04 ENCOUNTER — Ambulatory Visit (HOSPITAL_COMMUNITY): Payer: BLUE CROSS/BLUE SHIELD

## 2017-05-17 ENCOUNTER — Other Ambulatory Visit (HOSPITAL_COMMUNITY): Payer: Self-pay | Admitting: Orthopedic Surgery

## 2017-05-17 DIAGNOSIS — M25512 Pain in left shoulder: Secondary | ICD-10-CM | POA: Diagnosis not present

## 2017-05-24 ENCOUNTER — Ambulatory Visit (HOSPITAL_COMMUNITY)
Admission: RE | Admit: 2017-05-24 | Discharge: 2017-05-24 | Disposition: A | Discharge status: Home / Self Care | Payer: PPO | Source: Ambulatory Visit | Attending: Orthopedic Surgery | Admitting: Orthopedic Surgery

## 2017-05-24 DIAGNOSIS — M25512 Pain in left shoulder: Secondary | ICD-10-CM

## 2017-05-24 DIAGNOSIS — M7552 Bursitis of left shoulder: Secondary | ICD-10-CM | POA: Diagnosis not present

## 2017-05-31 DIAGNOSIS — M25512 Pain in left shoulder: Secondary | ICD-10-CM | POA: Diagnosis not present

## 2017-06-04 DIAGNOSIS — M25512 Pain in left shoulder: Secondary | ICD-10-CM | POA: Diagnosis not present

## 2017-06-04 DIAGNOSIS — M7502 Adhesive capsulitis of left shoulder: Secondary | ICD-10-CM | POA: Diagnosis not present

## 2017-06-04 DIAGNOSIS — M75112 Incomplete rotator cuff tear or rupture of left shoulder, not specified as traumatic: Secondary | ICD-10-CM | POA: Diagnosis not present

## 2017-06-06 DIAGNOSIS — M7502 Adhesive capsulitis of left shoulder: Secondary | ICD-10-CM | POA: Diagnosis not present

## 2017-06-06 DIAGNOSIS — M75112 Incomplete rotator cuff tear or rupture of left shoulder, not specified as traumatic: Secondary | ICD-10-CM | POA: Diagnosis not present

## 2017-06-06 DIAGNOSIS — M25512 Pain in left shoulder: Secondary | ICD-10-CM | POA: Diagnosis not present

## 2017-06-11 DIAGNOSIS — M7502 Adhesive capsulitis of left shoulder: Secondary | ICD-10-CM | POA: Diagnosis not present

## 2017-06-11 DIAGNOSIS — M25512 Pain in left shoulder: Secondary | ICD-10-CM | POA: Diagnosis not present

## 2017-06-11 DIAGNOSIS — M75112 Incomplete rotator cuff tear or rupture of left shoulder, not specified as traumatic: Secondary | ICD-10-CM | POA: Diagnosis not present

## 2017-06-21 DIAGNOSIS — M25512 Pain in left shoulder: Secondary | ICD-10-CM | POA: Diagnosis not present

## 2017-07-03 DIAGNOSIS — H2511 Age-related nuclear cataract, right eye: Secondary | ICD-10-CM | POA: Diagnosis not present

## 2017-07-03 DIAGNOSIS — H353132 Nonexudative age-related macular degeneration, bilateral, intermediate dry stage: Secondary | ICD-10-CM | POA: Diagnosis not present

## 2017-07-03 DIAGNOSIS — H5202 Hypermetropia, left eye: Secondary | ICD-10-CM | POA: Diagnosis not present

## 2017-07-03 DIAGNOSIS — H5213 Myopia, bilateral: Secondary | ICD-10-CM | POA: Diagnosis not present

## 2017-07-03 DIAGNOSIS — Z961 Presence of intraocular lens: Secondary | ICD-10-CM | POA: Diagnosis not present

## 2017-07-03 DIAGNOSIS — H524 Presbyopia: Secondary | ICD-10-CM | POA: Diagnosis not present

## 2017-07-03 DIAGNOSIS — H52203 Unspecified astigmatism, bilateral: Secondary | ICD-10-CM | POA: Diagnosis not present

## 2017-11-04 DIAGNOSIS — Z1389 Encounter for screening for other disorder: Secondary | ICD-10-CM | POA: Diagnosis not present

## 2017-11-04 DIAGNOSIS — Z0000 Encounter for general adult medical examination without abnormal findings: Secondary | ICD-10-CM | POA: Diagnosis not present

## 2017-11-04 DIAGNOSIS — Z0001 Encounter for general adult medical examination with abnormal findings: Secondary | ICD-10-CM | POA: Diagnosis not present

## 2017-11-04 DIAGNOSIS — Z6826 Body mass index (BMI) 26.0-26.9, adult: Secondary | ICD-10-CM | POA: Diagnosis not present

## 2017-11-04 DIAGNOSIS — E663 Overweight: Secondary | ICD-10-CM | POA: Diagnosis not present

## 2017-11-18 DIAGNOSIS — H43811 Vitreous degeneration, right eye: Secondary | ICD-10-CM | POA: Diagnosis not present

## 2017-11-18 DIAGNOSIS — H353222 Exudative age-related macular degeneration, left eye, with inactive choroidal neovascularization: Secondary | ICD-10-CM | POA: Diagnosis not present

## 2017-11-18 DIAGNOSIS — H43822 Vitreomacular adhesion, left eye: Secondary | ICD-10-CM | POA: Diagnosis not present

## 2017-11-18 DIAGNOSIS — H26492 Other secondary cataract, left eye: Secondary | ICD-10-CM | POA: Diagnosis not present

## 2017-11-18 DIAGNOSIS — H35712 Central serous chorioretinopathy, left eye: Secondary | ICD-10-CM | POA: Diagnosis not present

## 2017-12-03 DIAGNOSIS — H26492 Other secondary cataract, left eye: Secondary | ICD-10-CM | POA: Diagnosis not present

## 2017-12-03 DIAGNOSIS — Z961 Presence of intraocular lens: Secondary | ICD-10-CM | POA: Diagnosis not present

## 2017-12-03 DIAGNOSIS — H2511 Age-related nuclear cataract, right eye: Secondary | ICD-10-CM | POA: Diagnosis not present

## 2017-12-11 DIAGNOSIS — H2511 Age-related nuclear cataract, right eye: Secondary | ICD-10-CM | POA: Diagnosis not present

## 2017-12-11 DIAGNOSIS — Z01419 Encounter for gynecological examination (general) (routine) without abnormal findings: Secondary | ICD-10-CM | POA: Diagnosis not present

## 2017-12-11 DIAGNOSIS — H26492 Other secondary cataract, left eye: Secondary | ICD-10-CM | POA: Diagnosis not present

## 2017-12-11 DIAGNOSIS — Z1231 Encounter for screening mammogram for malignant neoplasm of breast: Secondary | ICD-10-CM | POA: Diagnosis not present

## 2017-12-11 DIAGNOSIS — Z6828 Body mass index (BMI) 28.0-28.9, adult: Secondary | ICD-10-CM | POA: Diagnosis not present

## 2018-01-08 DIAGNOSIS — H2511 Age-related nuclear cataract, right eye: Secondary | ICD-10-CM | POA: Diagnosis not present

## 2018-07-16 ENCOUNTER — Other Ambulatory Visit: Payer: Self-pay | Admitting: Physician Assistant

## 2018-07-16 DIAGNOSIS — D485 Neoplasm of uncertain behavior of skin: Secondary | ICD-10-CM | POA: Diagnosis not present

## 2018-07-16 DIAGNOSIS — D229 Melanocytic nevi, unspecified: Secondary | ICD-10-CM | POA: Diagnosis not present

## 2018-07-16 DIAGNOSIS — L570 Actinic keratosis: Secondary | ICD-10-CM | POA: Diagnosis not present

## 2018-07-16 DIAGNOSIS — D225 Melanocytic nevi of trunk: Secondary | ICD-10-CM | POA: Diagnosis not present

## 2018-07-16 DIAGNOSIS — L820 Inflamed seborrheic keratosis: Secondary | ICD-10-CM | POA: Diagnosis not present

## 2018-07-16 DIAGNOSIS — C44619 Basal cell carcinoma of skin of left upper limb, including shoulder: Secondary | ICD-10-CM | POA: Diagnosis not present

## 2018-07-16 DIAGNOSIS — C4491 Basal cell carcinoma of skin, unspecified: Secondary | ICD-10-CM

## 2018-07-16 HISTORY — DX: Melanocytic nevi, unspecified: D22.9

## 2018-07-16 HISTORY — DX: Basal cell carcinoma of skin, unspecified: C44.91

## 2018-08-21 ENCOUNTER — Other Ambulatory Visit: Payer: Self-pay | Admitting: Physician Assistant

## 2018-08-21 DIAGNOSIS — D235 Other benign neoplasm of skin of trunk: Secondary | ICD-10-CM | POA: Diagnosis not present

## 2018-08-21 DIAGNOSIS — L988 Other specified disorders of the skin and subcutaneous tissue: Secondary | ICD-10-CM | POA: Diagnosis not present

## 2018-08-21 DIAGNOSIS — C44619 Basal cell carcinoma of skin of left upper limb, including shoulder: Secondary | ICD-10-CM | POA: Diagnosis not present

## 2018-10-28 ENCOUNTER — Other Ambulatory Visit: Payer: Self-pay

## 2018-10-28 DIAGNOSIS — J209 Acute bronchitis, unspecified: Secondary | ICD-10-CM | POA: Diagnosis not present

## 2018-10-28 DIAGNOSIS — L989 Disorder of the skin and subcutaneous tissue, unspecified: Secondary | ICD-10-CM | POA: Diagnosis not present

## 2018-10-28 DIAGNOSIS — R05 Cough: Secondary | ICD-10-CM | POA: Diagnosis not present

## 2018-10-28 NOTE — Patient Outreach (Signed)
[facility]) Care Management  10/28/2018  Yesenia Sanchez 1951/05/19 [id number]   Referral Date: 10/27/2018 Referral Source: Nurseline Referral Reason: Cold symptoms   Outreach Attempt: no answer.  HIPAA compliant voice message left.  Plan: RN CM will attempt patient again within 4 business days and send letter.   Jone Baseman, RN, MSN [facility] Care Management Care Management Coordinator Direct Line [phone] Toll Free: [phone]  Fax: [phone]

## 2018-10-30 ENCOUNTER — Other Ambulatory Visit: Payer: Self-pay

## 2018-10-30 NOTE — Patient Outreach (Signed)
[facility]) Care Management  10/30/2018  Yesenia Sanchez 1950/11/23 [id number]   Referral Date: 10/27/2018 Referral Source: Nurseline Referral Reason: Cold symptoms   Outreach Attempt: no answer.  HIPAA compliant voice message left.  Plan: RN CM will attempt patient again within 4 business days.  Jone Baseman, RN, MSN [employer] [facility] Care Management Coordinator Direct Line [phone] Cell [phone] Toll Free: [phone]  Fax: [phone]

## 2018-10-31 ENCOUNTER — Other Ambulatory Visit: Payer: Self-pay

## 2018-10-31 NOTE — Patient Outreach (Signed)
[facility] [facility]) Care Management  10/31/2018  Yesenia Sanchez 03-31-1951 [id number]   Referral Date:10/27/2018 Referral Source:Nurseline Referral Reason:Cold symptoms   Outreach Attempt:no answer. HIPAA compliant voice message left.  Plan: RN CM will wait return call.  If no return call will close case.  Jone Baseman, RN, MSN [place] Management Care Management Coordinator Direct Line [phone] Cell [phone] Toll Free: [phone]  Fax: [phone]

## 2018-11-10 DIAGNOSIS — C44622 Squamous cell carcinoma of skin of right upper limb, including shoulder: Secondary | ICD-10-CM | POA: Diagnosis not present

## 2018-11-11 ENCOUNTER — Other Ambulatory Visit: Payer: Self-pay

## 2018-11-11 NOTE — Patient Outreach (Signed)
[facility]) Care Management  11/11/2018  Yesenia Sanchez 1950/12/09 [id number]   Multiple attempts to establish contact with patient without success. No response from letter mailed to patient.   Plan: RN CM will close case at this time.   Jone Baseman, RN, MSN [place] Management Care Management Coordinator Direct Line [phone] Cell [phone] Toll Free: [phone]  Fax: [phone]

## 2018-11-18 DIAGNOSIS — H353222 Exudative age-related macular degeneration, left eye, with inactive choroidal neovascularization: Secondary | ICD-10-CM | POA: Diagnosis not present

## 2018-11-18 DIAGNOSIS — H35712 Central serous chorioretinopathy, left eye: Secondary | ICD-10-CM | POA: Diagnosis not present

## 2018-11-18 DIAGNOSIS — H353131 Nonexudative age-related macular degeneration, bilateral, early dry stage: Secondary | ICD-10-CM | POA: Diagnosis not present

## 2018-11-18 DIAGNOSIS — H43811 Vitreous degeneration, right eye: Secondary | ICD-10-CM | POA: Diagnosis not present

## 2018-12-15 DIAGNOSIS — E663 Overweight: Secondary | ICD-10-CM | POA: Diagnosis not present

## 2018-12-15 DIAGNOSIS — Z1389 Encounter for screening for other disorder: Secondary | ICD-10-CM | POA: Diagnosis not present

## 2018-12-15 DIAGNOSIS — Z6827 Body mass index (BMI) 27.0-27.9, adult: Secondary | ICD-10-CM | POA: Diagnosis not present

## 2018-12-15 DIAGNOSIS — Z0000 Encounter for general adult medical examination without abnormal findings: Secondary | ICD-10-CM | POA: Diagnosis not present

## 2018-12-15 DIAGNOSIS — Z0001 Encounter for general adult medical examination with abnormal findings: Secondary | ICD-10-CM | POA: Diagnosis not present

## 2018-12-24 DIAGNOSIS — Z85828 Personal history of other malignant neoplasm of skin: Secondary | ICD-10-CM | POA: Diagnosis not present

## 2018-12-24 DIAGNOSIS — Z08 Encounter for follow-up examination after completed treatment for malignant neoplasm: Secondary | ICD-10-CM | POA: Diagnosis not present

## 2019-08-04 DIAGNOSIS — Z1231 Encounter for screening mammogram for malignant neoplasm of breast: Secondary | ICD-10-CM | POA: Diagnosis not present

## 2019-08-27 ENCOUNTER — Other Ambulatory Visit: Payer: Self-pay | Admitting: Obstetrics and Gynecology

## 2019-08-27 DIAGNOSIS — N6320 Unspecified lump in the left breast, unspecified quadrant: Secondary | ICD-10-CM

## 2019-09-02 ENCOUNTER — Other Ambulatory Visit: Payer: Self-pay

## 2019-09-02 ENCOUNTER — Ambulatory Visit
Admission: RE | Admit: 2019-09-02 | Discharge: 2019-09-02 | Disposition: A | Discharge status: Home / Self Care | Payer: PPO | Source: Ambulatory Visit | Attending: Obstetrics and Gynecology | Admitting: Obstetrics and Gynecology

## 2019-09-02 ENCOUNTER — Ambulatory Visit
Admission: RE | Admit: 2019-09-02 | Discharge: 2019-09-02 | Disposition: A | Discharge status: Home / Self Care | Payer: BLUE CROSS/BLUE SHIELD | Source: Ambulatory Visit | Attending: Obstetrics and Gynecology | Admitting: Obstetrics and Gynecology

## 2019-09-02 DIAGNOSIS — R928 Other abnormal and inconclusive findings on diagnostic imaging of breast: Secondary | ICD-10-CM | POA: Diagnosis not present

## 2019-09-02 DIAGNOSIS — N6002 Solitary cyst of left breast: Secondary | ICD-10-CM | POA: Diagnosis not present

## 2019-09-02 DIAGNOSIS — N6320 Unspecified lump in the left breast, unspecified quadrant: Secondary | ICD-10-CM

## 2019-11-03 ENCOUNTER — Ambulatory Visit: Payer: PPO | Attending: Internal Medicine

## 2019-11-03 ENCOUNTER — Other Ambulatory Visit: Payer: Self-pay

## 2019-11-03 DIAGNOSIS — Z20822 Contact with and (suspected) exposure to covid-19: Secondary | ICD-10-CM

## 2019-11-04 LAB — NOVEL CORONAVIRUS, NAA: SARS-CoV-2, NAA: NOT DETECTED

## 2019-11-05 ENCOUNTER — Telehealth: Payer: Self-pay | Admitting: *Deleted

## 2019-11-05 NOTE — Telephone Encounter (Signed)
Patient called given negative covid results . 

## 2019-11-19 DIAGNOSIS — H353131 Nonexudative age-related macular degeneration, bilateral, early dry stage: Secondary | ICD-10-CM | POA: Diagnosis not present

## 2019-11-19 DIAGNOSIS — H43811 Vitreous degeneration, right eye: Secondary | ICD-10-CM | POA: Diagnosis not present

## 2019-11-19 DIAGNOSIS — H35712 Central serous chorioretinopathy, left eye: Secondary | ICD-10-CM | POA: Diagnosis not present

## 2019-11-19 DIAGNOSIS — H353222 Exudative age-related macular degeneration, left eye, with inactive choroidal neovascularization: Secondary | ICD-10-CM | POA: Diagnosis not present

## 2020-01-07 DIAGNOSIS — H524 Presbyopia: Secondary | ICD-10-CM | POA: Diagnosis not present

## 2020-01-07 DIAGNOSIS — Z961 Presence of intraocular lens: Secondary | ICD-10-CM | POA: Diagnosis not present

## 2020-01-21 DIAGNOSIS — R7309 Other abnormal glucose: Secondary | ICD-10-CM | POA: Diagnosis not present

## 2020-01-21 DIAGNOSIS — Z0001 Encounter for general adult medical examination with abnormal findings: Secondary | ICD-10-CM | POA: Diagnosis not present

## 2020-01-21 DIAGNOSIS — Z1389 Encounter for screening for other disorder: Secondary | ICD-10-CM | POA: Diagnosis not present

## 2020-01-21 DIAGNOSIS — E663 Overweight: Secondary | ICD-10-CM | POA: Diagnosis not present

## 2020-01-21 DIAGNOSIS — Z6827 Body mass index (BMI) 27.0-27.9, adult: Secondary | ICD-10-CM | POA: Diagnosis not present

## 2020-01-21 DIAGNOSIS — E7849 Other hyperlipidemia: Secondary | ICD-10-CM | POA: Diagnosis not present

## 2020-02-15 DIAGNOSIS — Z6828 Body mass index (BMI) 28.0-28.9, adult: Secondary | ICD-10-CM | POA: Diagnosis not present

## 2020-02-15 DIAGNOSIS — H6123 Impacted cerumen, bilateral: Secondary | ICD-10-CM | POA: Diagnosis not present

## 2020-02-15 DIAGNOSIS — M7552 Bursitis of left shoulder: Secondary | ICD-10-CM | POA: Diagnosis not present

## 2020-02-15 DIAGNOSIS — E663 Overweight: Secondary | ICD-10-CM | POA: Diagnosis not present

## 2020-02-16 DIAGNOSIS — R5383 Other fatigue: Secondary | ICD-10-CM | POA: Diagnosis not present

## 2020-05-18 ENCOUNTER — Encounter (INDEPENDENT_AMBULATORY_CARE_PROVIDER_SITE_OTHER): Payer: PPO | Admitting: Ophthalmology

## 2020-06-08 ENCOUNTER — Ambulatory Visit (INDEPENDENT_AMBULATORY_CARE_PROVIDER_SITE_OTHER): Payer: PPO | Admitting: Ophthalmology

## 2020-06-08 ENCOUNTER — Encounter (INDEPENDENT_AMBULATORY_CARE_PROVIDER_SITE_OTHER): Payer: Self-pay | Admitting: Ophthalmology

## 2020-06-08 ENCOUNTER — Other Ambulatory Visit: Payer: Self-pay

## 2020-06-08 DIAGNOSIS — H353222 Exudative age-related macular degeneration, left eye, with inactive choroidal neovascularization: Secondary | ICD-10-CM | POA: Diagnosis not present

## 2020-06-08 DIAGNOSIS — H35712 Central serous chorioretinopathy, left eye: Secondary | ICD-10-CM | POA: Insufficient documentation

## 2020-06-08 DIAGNOSIS — H35361 Drusen (degenerative) of macula, right eye: Secondary | ICD-10-CM | POA: Diagnosis not present

## 2020-06-08 DIAGNOSIS — Z9889 Other specified postprocedural states: Secondary | ICD-10-CM | POA: Insufficient documentation

## 2020-06-08 DIAGNOSIS — H43811 Vitreous degeneration, right eye: Secondary | ICD-10-CM | POA: Diagnosis not present

## 2020-06-08 DIAGNOSIS — H353131 Nonexudative age-related macular degeneration, bilateral, early dry stage: Secondary | ICD-10-CM | POA: Diagnosis not present

## 2020-06-08 DIAGNOSIS — H35362 Drusen (degenerative) of macula, left eye: Secondary | ICD-10-CM | POA: Diagnosis not present

## 2020-06-08 DIAGNOSIS — H43822 Vitreomacular adhesion, left eye: Secondary | ICD-10-CM | POA: Diagnosis not present

## 2020-06-08 DIAGNOSIS — H353132 Nonexudative age-related macular degeneration, bilateral, intermediate dry stage: Secondary | ICD-10-CM | POA: Insufficient documentation

## 2020-06-08 NOTE — Progress Notes (Signed)
06/08/2020     CHIEF COMPLAINT Patient presents for Retina Follow Up   HISTORY OF PRESENT ILLNESS: Yesenia Sanchez is a 69 y.o. female who presents to the clinic today for:   HPI    Retina Follow Up    Patient presents with  Dry AMD.  In both eyes.  Severity is moderate.  Duration of 6 months.  Since onset it is stable.  I, the attending physician,  performed the HPI with the patient and updated documentation appropriately.          Comments    6 Month Dry AMD f\u OU. OCT and FP  Pt states OU are doing as well as they can. Denies any complaints       Last edited by Tilda Franco on 06/08/2020 10:07 AM. (History)      Referring physician: Sharilyn Sites, MD [address] [place],  [place] [address]  HISTORICAL INFORMATION:   Selected notes from the [facility]: No current outpatient medications on file. (Ophthalmic Drugs)   No current facility-administered medications for this visit. (Ophthalmic Drugs)   Current Outpatient Medications (Other)  Medication Sig  . Coenzyme Q10 (COQ10 PO) Take 1 tablet by mouth daily.  Marland Kitchen FLUoxetine (PROZAC) 10 MG capsule Take 10 mg by mouth 3 (three) times daily.  Marland Kitchen ibuprofen (ADVIL,MOTRIN) 200 MG tablet Take 400 mg by mouth every 6 (six) hours as needed for moderate pain.  . simvastatin (ZOCOR) 10 MG tablet Take 10 mg by mouth daily.   No current facility-administered medications for this visit. (Other)      REVIEW OF SYSTEMS:    ALLERGIES Allergies  Allergen Reactions  . Codeine Hives  . Sulfa Antibiotics Hives    PAST MEDICAL HISTORY Past Medical History:  Diagnosis Date  . Allergy   . Anemia    in my 20's  . Anxiety   . Atypical nevus 07/16/2018   mid. abdomen (moderate w/s)  . BCC (basal cell carcinoma of skin) 07/16/2018   bcc sup. nod. cx3 83fu  . Cataract    right eye, left eye cateract was removed  . Constipation   . Fatigue   . Hyperlipidemia   .  Hyperlipidemia   . Insomnia   . Night sweats   . Urinary incontinence    Past Surgical History:  Procedure Laterality Date  . CATARACT EXTRACTION W/PHACO Left 08/02/2015   Procedure: CATARACT EXTRACTION PHACO AND INTRAOCULAR LENS PLACEMENT LEFT EYE;  Surgeon: Rutherford Guys, MD;  Location: [facility];  Service: Ophthalmology;  Laterality: Left;  CDE:4.01  . MOUTH SURGERY    . TUBAL LIGATION      FAMILY HISTORY Family History  Problem Relation Age of Onset  . Heart disease Mother   . Hypertension Father   . Diabetes Father   . Stroke Father   . Hypertension Sister   . Diabetes Sister   . Colon polyps Sister   . Colon cancer Neg Hx   . Esophageal cancer Neg Hx   . Rectal cancer Neg Hx   . Stomach cancer Neg Hx     SOCIAL HISTORY Social History   Tobacco Use  . Smoking status: Never Smoker  . Smokeless tobacco: Never Used  Substance Use Topics  . Alcohol use: No    Alcohol/week: 0.0 standard drinks  . Drug use: No         OPHTHALMIC EXAM: Base Eye Exam    Visual  Acuity (Snellen - Linear)      Right Left   Dist [place] 20/20 -2 20/40 -2   Dist ph [place]  20/30       Tonometry (Tonopen, 10:11 AM)      Right Left   Pressure 12 14       Pupils      Pupils Dark Light Shape React APD   Right PERRL 3 3 Round Minimal None   Left PERRL 3 3 Round Minimal None       Visual Fields (Counting fingers)      Left Right    Full Full       Neuro/Psych    Oriented x3: Yes   Mood/Affect: Normal       Dilation    Both eyes: 1.0% Mydriacyl, 2.5% Phenylephrine @ 10:11 AM        Slit Lamp and Fundus Exam    External Exam      Right Left   External Normal Normal       Slit Lamp Exam      Right Left   Lids/Lashes Normal Normal   Conjunctiva/Sclera White and quiet White and quiet   Cornea Clear Clear   Anterior Chamber Deep and quiet Deep and quiet   Iris Round and reactive Round and reactive   Lens Centered posterior chamber intraocular lens Centered posterior chamber  intraocular lens   Anterior Vitreous Normal Normal       Fundus Exam      Right Left   Posterior Vitreous Posterior vitreous detachment Vitrectomized   Disc Normal Normal   C/D Ratio 0.25 0.3   Macula Hard drusen, no hemorrhage, no exudates, no macular thickening Hard drusen, no hemorrhage, no exudates, no macular thickening, Retinal pigment epithelial mottling   Vessels Normal Normal   Periphery Normal Normal          IMAGING AND PROCEDURES  Imaging and Procedures for 06/08/20  OCT, Retina - OU - Both Eyes       Right Eye Quality was good. Scan locations included subfoveal. Central Foveal Thickness: 270. Progression has been stable. Findings include no SRF, abnormal foveal contour, retinal drusen .   Left Eye Quality was good. Scan locations included subfoveal. Central Foveal Thickness: 208. Progression has been stable. Findings include no SRF, retinal drusen , subretinal hyper-reflective material.                 ASSESSMENT/PLAN:  No problem-specific Assessment & Plan notes found for this encounter.      ICD-10-CM   1. Exudative age-related macular degeneration of left eye with inactive choroidal neovascularization (HCC)  H35.3222 OCT, Retina - OU - Both Eyes    CANCELED: Color Fundus Photography Optos - OU - Both Eyes  2. Early stage nonexudative age-related macular degeneration of both eyes  H35.3131 OCT, Retina - OU - Both Eyes    CANCELED: Color Fundus Photography Optos - OU - Both Eyes  3. 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 Posterior vitreous detachment of right eye  H43.811   5. Vitreomacular adhesion of left eye  H43.822   6. Degenerative retinal drusen of left eye  H35.362   7. Degenerative retinal drusen of right eye  H35.361   8. History of vitrectomy  Z98.890     1.  2.  3.  Ophthalmic Meds Ordered this visit:  No orders of the defined types were placed in this encounter.  Return in about 1  year (around 06/08/2021) for COLOR FP, DILATE OU, OCT.  There are no Patient Instructions on file for this visit.   Explained the diagnoses, plan, and follow up with the patient and they expressed understanding.  Patient expressed understanding of the importance of proper follow up care.   Clent Demark Isidore Margraf M.D. Diseases & Surgery of the Retina and Vitreous Retina & Diabetic [place] 06/08/20     Abbreviations: M myopia (nearsighted); A astigmatism; H hyperopia (farsighted); P presbyopia; Mrx spectacle prescription;  CTL contact lenses; OD right eye; OS left eye; OU both eyes  XT exotropia; ET esotropia; PEK punctate epithelial keratitis; PEE punctate epithelial erosions; DES dry eye syndrome; MGD meibomian gland dysfunction; ATs artificial tears; PFAT's preservative free artificial tears; [place] nuclear sclerotic cataract; PSC posterior subcapsular cataract; ERM epi-retinal membrane; PVD posterior vitreous detachment; RD retinal detachment; DM diabetes mellitus; DR diabetic retinopathy; NPDR non-proliferative diabetic retinopathy; PDR proliferative diabetic retinopathy; CSME clinically significant macular edema; DME diabetic macular edema; dbh dot blot hemorrhages; CWS cotton wool spot; POAG primary open angle glaucoma; C/D cup-to-disc ratio; HVF humphrey visual field; GVF goldmann visual field; OCT optical coherence tomography; IOP intraocular pressure; BRVO Branch retinal vein occlusion; CRVO central retinal vein occlusion; CRAO central retinal artery occlusion; BRAO branch retinal artery occlusion; RT retinal tear; SB scleral buckle; PPV pars plana vitrectomy; VH Vitreous hemorrhage; PRP panretinal laser photocoagulation; IVK intravitreal kenalog; VMT vitreomacular traction; MH Macular hole;  NVD neovascularization of the disc; NVE neovascularization elsewhere; AREDS age related eye disease study; ARMD age related macular degeneration; POAG primary open angle glaucoma; EBMD epithelial/anterior  basement membrane dystrophy; ACIOL anterior chamber intraocular lens; IOL intraocular lens; PCIOL posterior chamber intraocular lens; Phaco/IOL phacoemulsification with intraocular lens placement; [place] photorefractive keratectomy; LASIK laser assisted in situ keratomileusis; HTN hypertension; DM diabetes mellitus; COPD chronic obstructive pulmonary disease

## 2020-06-08 NOTE — Assessment & Plan Note (Signed)

## 2020-07-20 DIAGNOSIS — Z08 Encounter for follow-up examination after completed treatment for malignant neoplasm: Secondary | ICD-10-CM | POA: Diagnosis not present

## 2020-07-20 DIAGNOSIS — Z1283 Encounter for screening for malignant neoplasm of skin: Secondary | ICD-10-CM | POA: Diagnosis not present

## 2020-07-20 DIAGNOSIS — Z85828 Personal history of other malignant neoplasm of skin: Secondary | ICD-10-CM | POA: Diagnosis not present

## 2020-07-20 DIAGNOSIS — D225 Melanocytic nevi of trunk: Secondary | ICD-10-CM | POA: Diagnosis not present

## 2020-08-17 ENCOUNTER — Ambulatory Visit: Payer: PPO | Admitting: Physician Assistant

## 2020-10-25 DIAGNOSIS — J019 Acute sinusitis, unspecified: Secondary | ICD-10-CM | POA: Diagnosis not present

## 2020-10-25 DIAGNOSIS — J301 Allergic rhinitis due to pollen: Secondary | ICD-10-CM | POA: Diagnosis not present

## 2020-10-25 DIAGNOSIS — Z681 Body mass index (BMI) 19 or less, adult: Secondary | ICD-10-CM | POA: Diagnosis not present

## 2020-10-31 ENCOUNTER — Ambulatory Visit (HOSPITAL_COMMUNITY)
Admission: RE | Admit: 2020-10-31 | Discharge: 2020-10-31 | Disposition: A | Discharge status: Home / Self Care | Payer: Medicare HMO | Source: Ambulatory Visit | Attending: Physician Assistant | Admitting: Physician Assistant

## 2020-10-31 ENCOUNTER — Other Ambulatory Visit (HOSPITAL_COMMUNITY): Payer: Self-pay | Admitting: Physician Assistant

## 2020-10-31 ENCOUNTER — Other Ambulatory Visit: Payer: Self-pay

## 2020-10-31 DIAGNOSIS — R051 Acute cough: Secondary | ICD-10-CM

## 2020-10-31 DIAGNOSIS — R059 Cough, unspecified: Secondary | ICD-10-CM | POA: Diagnosis not present

## 2020-11-03 ENCOUNTER — Ambulatory Visit: Payer: PPO | Admitting: Physician Assistant

## 2020-11-21 ENCOUNTER — Other Ambulatory Visit: Payer: Self-pay | Admitting: Family Medicine

## 2020-11-21 DIAGNOSIS — Z1331 Encounter for screening for depression: Secondary | ICD-10-CM | POA: Diagnosis not present

## 2020-11-21 DIAGNOSIS — Z6828 Body mass index (BMI) 28.0-28.9, adult: Secondary | ICD-10-CM | POA: Diagnosis not present

## 2020-11-21 DIAGNOSIS — J349 Unspecified disorder of nose and nasal sinuses: Secondary | ICD-10-CM | POA: Diagnosis not present

## 2020-11-21 DIAGNOSIS — J019 Acute sinusitis, unspecified: Secondary | ICD-10-CM | POA: Diagnosis not present

## 2020-11-21 DIAGNOSIS — E039 Hypothyroidism, unspecified: Secondary | ICD-10-CM | POA: Diagnosis not present

## 2020-11-21 DIAGNOSIS — Z1231 Encounter for screening mammogram for malignant neoplasm of breast: Secondary | ICD-10-CM

## 2020-11-21 DIAGNOSIS — Z1389 Encounter for screening for other disorder: Secondary | ICD-10-CM | POA: Diagnosis not present

## 2020-11-21 DIAGNOSIS — R053 Chronic cough: Secondary | ICD-10-CM | POA: Diagnosis not present

## 2020-11-25 ENCOUNTER — Other Ambulatory Visit: Payer: Self-pay | Admitting: Family Medicine

## 2020-11-25 DIAGNOSIS — E2839 Other primary ovarian failure: Secondary | ICD-10-CM

## 2020-12-02 ENCOUNTER — Encounter: Payer: Self-pay | Admitting: Gastroenterology

## 2020-12-15 DIAGNOSIS — R030 Elevated blood-pressure reading, without diagnosis of hypertension: Secondary | ICD-10-CM | POA: Diagnosis not present

## 2020-12-15 DIAGNOSIS — Z8249 Family history of ischemic heart disease and other diseases of the circulatory system: Secondary | ICD-10-CM | POA: Diagnosis not present

## 2020-12-15 DIAGNOSIS — H547 Unspecified visual loss: Secondary | ICD-10-CM | POA: Diagnosis not present

## 2020-12-15 DIAGNOSIS — I499 Cardiac arrhythmia, unspecified: Secondary | ICD-10-CM | POA: Diagnosis not present

## 2020-12-15 DIAGNOSIS — I951 Orthostatic hypotension: Secondary | ICD-10-CM | POA: Diagnosis not present

## 2020-12-15 DIAGNOSIS — E663 Overweight: Secondary | ICD-10-CM | POA: Diagnosis not present

## 2020-12-15 DIAGNOSIS — Z823 Family history of stroke: Secondary | ICD-10-CM | POA: Diagnosis not present

## 2020-12-15 DIAGNOSIS — Z7722 Contact with and (suspected) exposure to environmental tobacco smoke (acute) (chronic): Secondary | ICD-10-CM | POA: Diagnosis not present

## 2020-12-15 DIAGNOSIS — R32 Unspecified urinary incontinence: Secondary | ICD-10-CM | POA: Diagnosis not present

## 2020-12-15 DIAGNOSIS — Z6828 Body mass index (BMI) 28.0-28.9, adult: Secondary | ICD-10-CM | POA: Diagnosis not present

## 2020-12-16 ENCOUNTER — Ambulatory Visit
Admission: RE | Admit: 2020-12-16 | Discharge: 2020-12-16 | Disposition: A | Discharge status: Home / Self Care | Payer: Medicare HMO | Source: Ambulatory Visit | Attending: Family Medicine | Admitting: Family Medicine

## 2020-12-16 ENCOUNTER — Other Ambulatory Visit: Payer: Self-pay

## 2020-12-16 DIAGNOSIS — Z13820 Encounter for screening for osteoporosis: Secondary | ICD-10-CM | POA: Diagnosis not present

## 2020-12-16 DIAGNOSIS — Z780 Asymptomatic menopausal state: Secondary | ICD-10-CM | POA: Diagnosis not present

## 2020-12-16 DIAGNOSIS — E2839 Other primary ovarian failure: Secondary | ICD-10-CM

## 2021-01-03 ENCOUNTER — Ambulatory Visit
Admission: RE | Admit: 2021-01-03 | Discharge: 2021-01-03 | Disposition: A | Discharge status: Home / Self Care | Payer: Medicare HMO | Source: Ambulatory Visit | Attending: Family Medicine | Admitting: Family Medicine

## 2021-01-03 ENCOUNTER — Other Ambulatory Visit: Payer: Self-pay

## 2021-01-03 DIAGNOSIS — Z1231 Encounter for screening mammogram for malignant neoplasm of breast: Secondary | ICD-10-CM | POA: Diagnosis not present

## 2021-01-06 DIAGNOSIS — Z961 Presence of intraocular lens: Secondary | ICD-10-CM | POA: Diagnosis not present

## 2021-01-06 DIAGNOSIS — H353131 Nonexudative age-related macular degeneration, bilateral, early dry stage: Secondary | ICD-10-CM | POA: Diagnosis not present

## 2021-01-06 DIAGNOSIS — H524 Presbyopia: Secondary | ICD-10-CM | POA: Diagnosis not present

## 2021-02-16 DIAGNOSIS — Z1331 Encounter for screening for depression: Secondary | ICD-10-CM | POA: Diagnosis not present

## 2021-02-16 DIAGNOSIS — Z6828 Body mass index (BMI) 28.0-28.9, adult: Secondary | ICD-10-CM | POA: Diagnosis not present

## 2021-02-16 DIAGNOSIS — E7849 Other hyperlipidemia: Secondary | ICD-10-CM | POA: Diagnosis not present

## 2021-02-16 DIAGNOSIS — Z0000 Encounter for general adult medical examination without abnormal findings: Secondary | ICD-10-CM | POA: Diagnosis not present

## 2021-02-16 DIAGNOSIS — R69 Illness, unspecified: Secondary | ICD-10-CM | POA: Diagnosis not present

## 2021-03-02 DIAGNOSIS — Z1389 Encounter for screening for other disorder: Secondary | ICD-10-CM | POA: Diagnosis not present

## 2021-03-02 DIAGNOSIS — E663 Overweight: Secondary | ICD-10-CM | POA: Diagnosis not present

## 2021-03-02 DIAGNOSIS — E7849 Other hyperlipidemia: Secondary | ICD-10-CM | POA: Diagnosis not present

## 2021-03-02 DIAGNOSIS — Z6828 Body mass index (BMI) 28.0-28.9, adult: Secondary | ICD-10-CM | POA: Diagnosis not present

## 2021-03-02 DIAGNOSIS — Z0000 Encounter for general adult medical examination without abnormal findings: Secondary | ICD-10-CM | POA: Diagnosis not present

## 2021-03-15 IMAGING — MG DIGITAL SCREENING BILAT W/ CAD
4 series · 4 of 4 positions shown · non-contrast
Comparison: Previous exam(s).

CLINICAL DATA: Screening.

EXAM:
DIGITAL SCREENING BILATERAL MAMMOGRAM WITH CAD
TECHNIQUE: Bilateral screening digital craniocaudal and mediolateral oblique
mammograms were obtained. The images were evaluated with
computer-aided detection.

[R MLO]
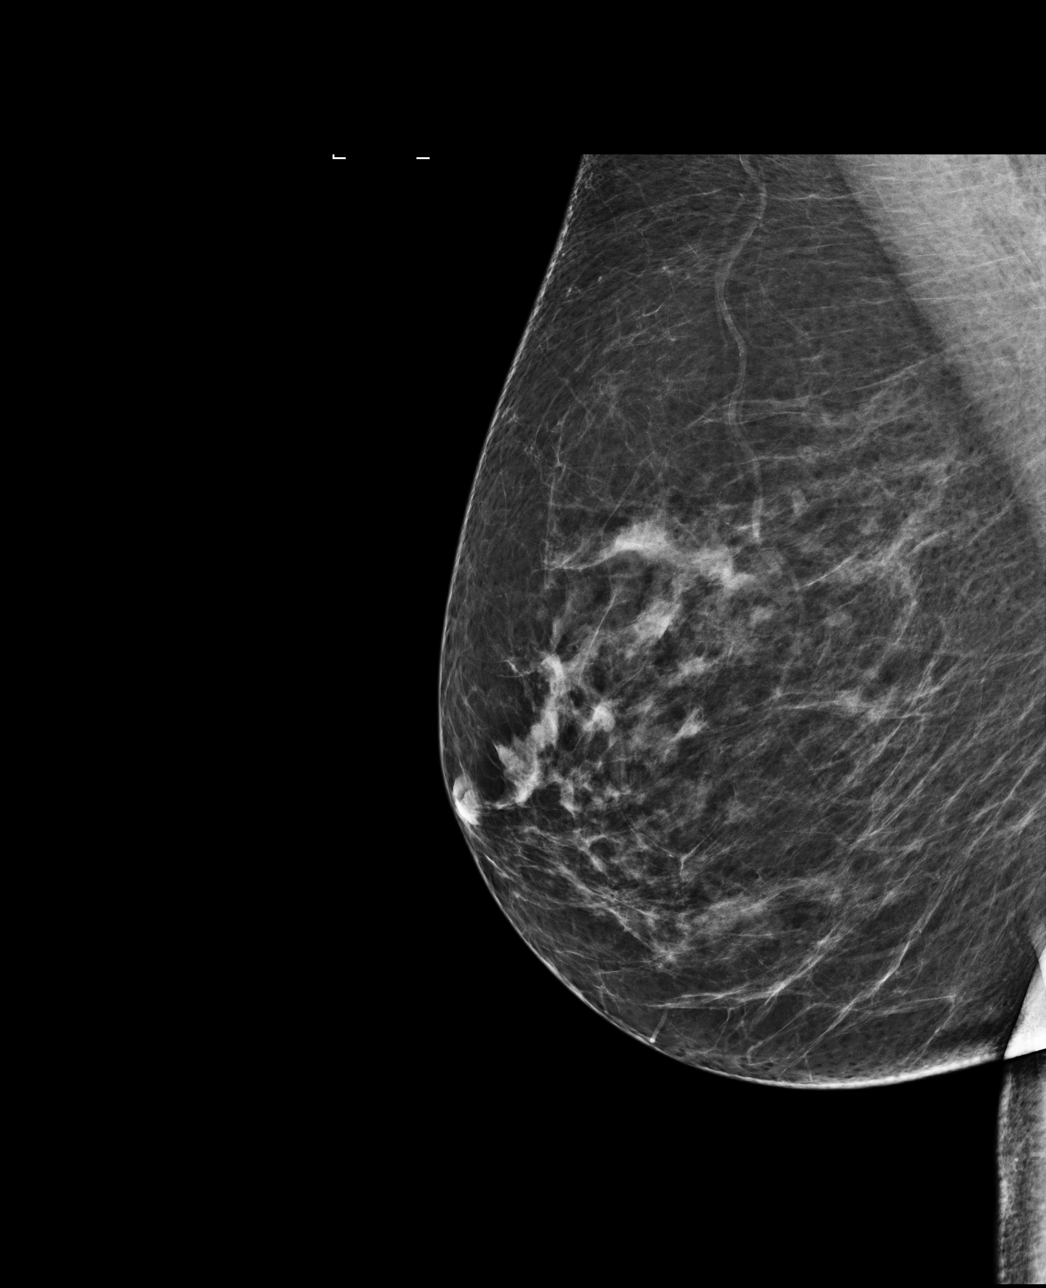

[L MLO]
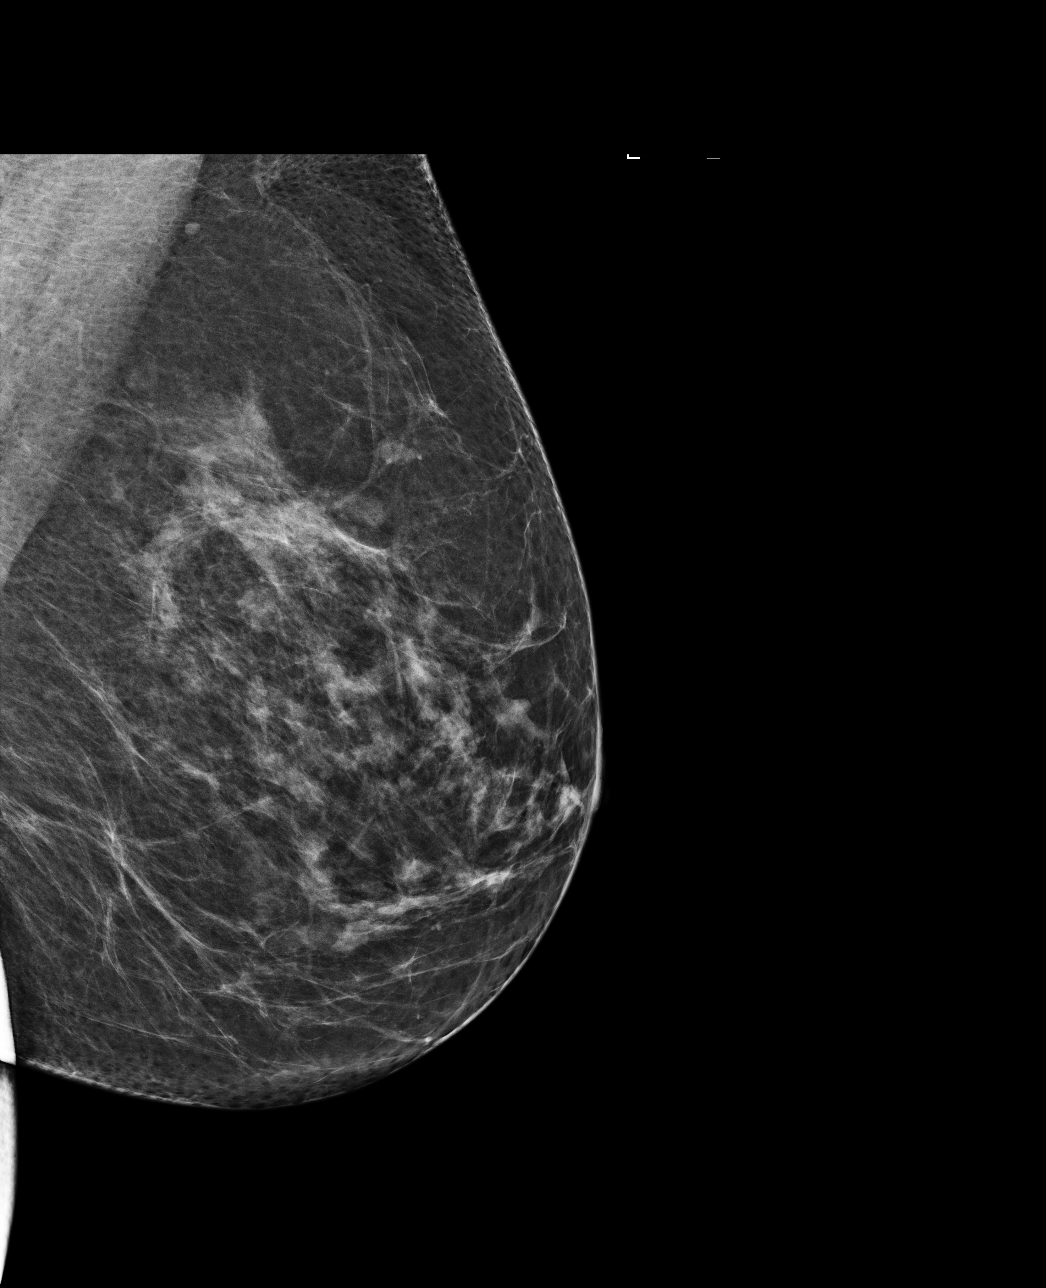

[L CC]
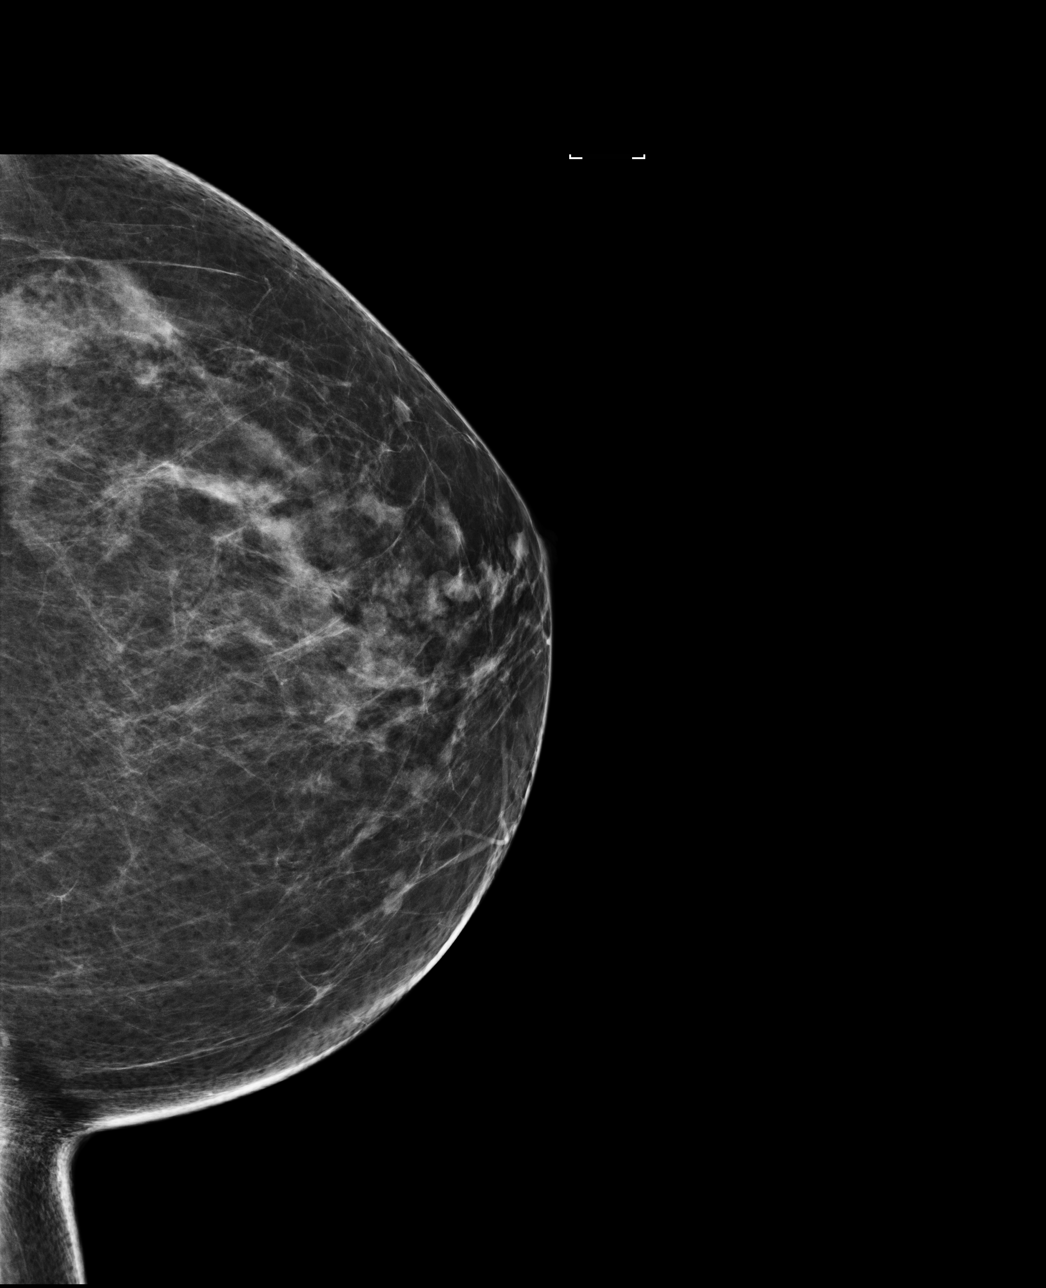

[R CC]
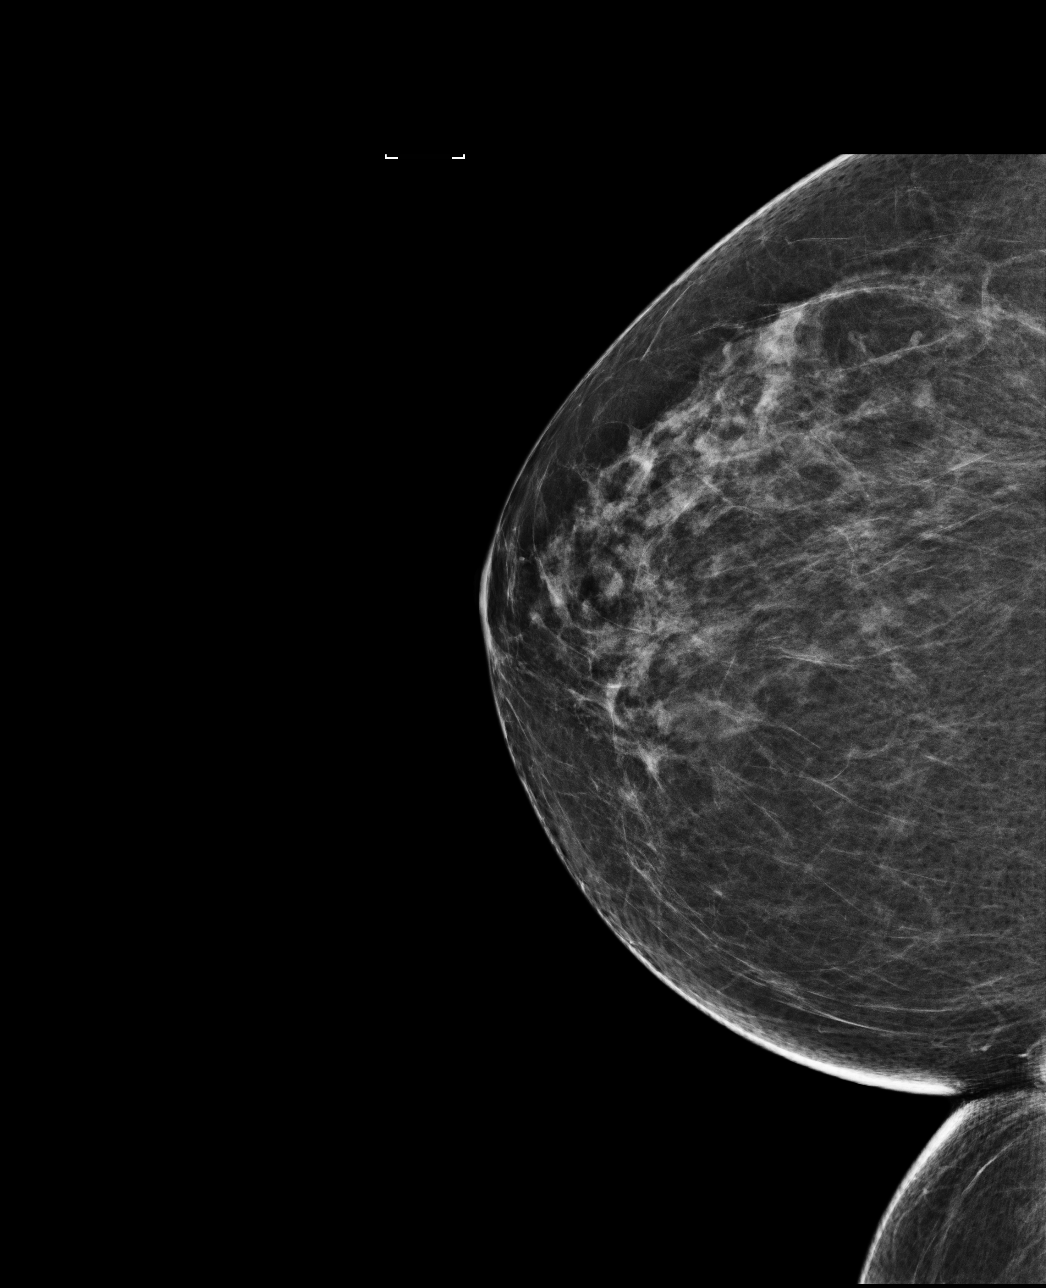

[4 of 4 positions shown; findings below may reference images not displayed]

ACR Breast Density Category b: There are scattered areas of
fibroglandular density.
FINDINGS: There are no findings suspicious for malignancy. The images were
evaluated with computer-aided detection.
IMPRESSION: No mammographic evidence of malignancy. A result letter of this
screening mammogram will be mailed directly to the patient.

RECOMMENDATION:
Screening mammogram in one year. (Code:XC-[id number])

BI-RADS CATEGORY  1: Negative.

## 2021-06-08 ENCOUNTER — Encounter (INDEPENDENT_AMBULATORY_CARE_PROVIDER_SITE_OTHER): Payer: Medicare HMO | Admitting: Ophthalmology

## 2021-06-13 ENCOUNTER — Ambulatory Visit (INDEPENDENT_AMBULATORY_CARE_PROVIDER_SITE_OTHER): Payer: Medicare HMO | Admitting: Ophthalmology

## 2021-06-13 ENCOUNTER — Other Ambulatory Visit: Payer: Self-pay

## 2021-06-13 ENCOUNTER — Encounter (INDEPENDENT_AMBULATORY_CARE_PROVIDER_SITE_OTHER): Payer: Self-pay | Admitting: Ophthalmology

## 2021-06-13 DIAGNOSIS — H353222 Exudative age-related macular degeneration, left eye, with inactive choroidal neovascularization: Secondary | ICD-10-CM

## 2021-06-13 DIAGNOSIS — H353132 Nonexudative age-related macular degeneration, bilateral, intermediate dry stage: Secondary | ICD-10-CM

## 2021-06-13 DIAGNOSIS — H353131 Nonexudative age-related macular degeneration, bilateral, early dry stage: Secondary | ICD-10-CM

## 2021-06-13 DIAGNOSIS — E119 Type 2 diabetes mellitus without complications: Secondary | ICD-10-CM | POA: Diagnosis not present

## 2021-06-13 NOTE — Progress Notes (Signed)
06/13/2021     CHIEF COMPLAINT Patient presents for  Chief Complaint  Patient presents with   Retina Follow Up      HISTORY OF PRESENT ILLNESS: Yesenia Sanchez is a 70 y.o. female who presents to the clinic today for:   HPI     Retina Follow Up           Diagnosis: Dry AMD   Laterality: both eyes   Onset: 1 year ago   Severity: moderate   Duration: 1 year   Course: stable   MD Performed: performed the HPI with the patient and updated documentation appropriately         Comments   1 yr fu ou oct, fp Patient states vision is stable and unchanged since last visit. Denies new floaters. Pt states "I have flashes of light but it not very often, I think I have always had them. It does not bother me."        Last edited by Laurin Coder, COA on 06/13/2021  2:53 PM.      Referring physician: Sharilyn Sites, MD [address] [place],  [place] [address]  HISTORICAL INFORMATION:   Selected notes from the [facility]: No current outpatient medications on file. (Ophthalmic Drugs)   No current facility-administered medications for this visit. (Ophthalmic Drugs)   Current Outpatient Medications (Other)  Medication Sig   Coenzyme Q10 (COQ10 PO) Take 1 tablet by mouth daily.   FLUoxetine (PROZAC) 10 MG capsule Take 10 mg by mouth 3 (three) times daily.   ibuprofen (ADVIL,MOTRIN) 200 MG tablet Take 400 mg by mouth every 6 (six) hours as needed for moderate pain.   simvastatin (ZOCOR) 10 MG tablet Take 10 mg by mouth daily.   No current facility-administered medications for this visit. (Other)      REVIEW OF SYSTEMS:    ALLERGIES Allergies  Allergen Reactions   Codeine Hives   Sulfa Antibiotics Hives    PAST MEDICAL HISTORY Past Medical History:  Diagnosis Date   Allergy    Anemia    in my 20's   Anxiety    Atypical nevus 07/16/2018   mid. abdomen (moderate w/s)   BCC (basal cell carcinoma of skin)  07/16/2018   bcc sup. nod. cx3 65f   Cataract    right eye, left eye cateract was removed   Constipation    Fatigue    Hyperlipidemia    Hyperlipidemia    Insomnia    Night sweats    Urinary incontinence    Past Surgical History:  Procedure Laterality Date   CATARACT EXTRACTION W/PHACO Left 08/02/2015   Procedure: CATARACT EXTRACTION PHACO AND INTRAOCULAR LENS PLACEMENT LEFT EYE;  Surgeon: MRutherford Guys MD;  Location: [facility];  Service: Ophthalmology;  Laterality: Left;  CDE:4.01   MOUTH SURGERY     TUBAL LIGATION      FAMILY HISTORY Family History  Problem Relation Age of Onset   Heart disease Mother    Hypertension Father    Diabetes Father    Stroke Father    Hypertension Sister    Diabetes Sister    Colon polyps Sister    Colon cancer Neg Hx    Esophageal cancer Neg Hx    Rectal cancer Neg Hx    Stomach cancer Neg Hx     SOCIAL HISTORY Social History   Tobacco Use   Smoking status: Never   Smokeless tobacco: Never  Substance Use Topics   Alcohol use: No    Alcohol/week: 0.0 standard drinks   Drug use: No         OPHTHALMIC EXAM:  Base Eye Exam     Visual Acuity (ETDRS)       Right Left   Dist [place] 20/20 -2 20/40 -2   Dist ph Mehran Guderian  NI         Tonometry (Tonopen, 2:43 PM)       Right Left   Pressure 13 15         Pupils       Pupils Dark Light Shape React APD   Right PERRL 3.5 3 Round Minimal None   Left PERRL 3.5 3 Round Minimal None         Visual Fields (Counting fingers)       Left Right    Full Full         Extraocular Movement       Right Left    Full Full         Neuro/Psych     Oriented x3: Yes   Mood/Affect: Normal         Dilation     Both eyes: 1.0% Mydriacyl, 2.5% Phenylephrine @ 2:43 PM           Slit Lamp and Fundus Exam     External Exam       Right Left   External Normal Normal         Slit Lamp Exam       Right Left   Lids/Lashes Normal Normal   Conjunctiva/Sclera White and  quiet White and quiet   Cornea Clear Clear   Anterior Chamber Deep and quiet Deep and quiet   Iris Round and reactive Round and reactive   Lens Centered posterior chamber intraocular lens Centered posterior chamber intraocular lens   Anterior Vitreous Normal Normal         Fundus Exam       Right Left   Posterior Vitreous Posterior vitreous detachment Vitrectomized   Disc Normal Normal   C/D Ratio 0.25 0.3   Macula Hard drusen, no hemorrhage, no exudates, no macular thickening Hard drusen, no hemorrhage, no exudates, no macular thickening, Retinal pigment epithelial mottling   Vessels Normal Normal   Periphery Normal Normal            IMAGING AND PROCEDURES  Imaging and Procedures for 06/13/21  OCT, Retina - OU - Both Eyes       Right Eye Quality was good. Scan locations included subfoveal. Central Foveal Thickness: 282. Progression has been stable. Findings include no SRF, abnormal foveal contour, retinal drusen .   Left Eye Quality was good. Scan locations included subfoveal. Central Foveal Thickness: 229. Progression has been stable. Findings include no SRF, retinal drusen , subretinal hyper-reflective material.      Color Fundus Photography Optos - OU - Both Eyes       Right Eye Progression has no prior data. Disc findings include normal observations. Macula : normal observations, drusen. Vessels : normal observations. Periphery : normal observations.   Left Eye Progression has no prior data. Disc findings include normal observations. Macula : drusen. Vessels : normal observations. Periphery : normal observations.   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 Exudative age-related  macular degeneration of left eye with inactive choroidal neovascularization (HCC)  H35.3222 OCT, Retina - OU - Both Eyes    Color Fundus Photography Optos - OU - Both Eyes    2. Early stage nonexudative age-related macular degeneration of both eyes  H35.3131 OCT, Retina - OU - Both Eyes    Color Fundus Photography Optos - OU - Both Eyes    3. Intermediate stage nonexudative age-related macular degeneration of both eyes  H35.3132       1.  OU, intermediate ARMD no change and no sign of CNVM OU  2.  3.  Ophthalmic Meds Ordered this visit:  No orders of the defined types were placed in this encounter.      Return in about 1 year (around 06/13/2022) for COLOR FP, OCT, DILATE OU.  There are no Patient Instructions on file for this visit.   Explained the diagnoses, plan, and follow up with the patient and they expressed understanding.  Patient expressed understanding of the importance of proper follow up care.   Clent Demark Shyan Scalisi M.D. Diseases & Surgery of the Retina and Vitreous Retina & Diabetic [place] 06/13/21     Abbreviations: M myopia (nearsighted); A astigmatism; H hyperopia (farsighted); P presbyopia; Mrx spectacle prescription;  CTL contact lenses; OD right eye; OS left eye; OU both eyes  XT exotropia; ET esotropia; PEK punctate epithelial keratitis; PEE punctate epithelial erosions; DES dry eye syndrome; MGD meibomian gland dysfunction; ATs artificial tears; PFAT's preservative free artificial tears; [place] nuclear sclerotic cataract; PSC posterior subcapsular cataract; ERM epi-retinal membrane; PVD posterior vitreous detachment; RD retinal detachment; DM diabetes mellitus; DR diabetic retinopathy; NPDR non-proliferative diabetic retinopathy; PDR proliferative diabetic retinopathy; CSME clinically significant macular edema; DME diabetic macular edema; dbh dot blot hemorrhages; CWS cotton wool spot; POAG primary open angle glaucoma; C/D cup-to-disc ratio; HVF humphrey visual  field; GVF goldmann visual field; OCT optical coherence tomography; IOP intraocular pressure; BRVO Branch retinal vein occlusion; CRVO central retinal vein occlusion; CRAO central retinal artery occlusion; BRAO branch retinal artery occlusion; RT retinal tear; SB scleral buckle; PPV pars plana vitrectomy; VH Vitreous hemorrhage; PRP panretinal laser photocoagulation; IVK intravitreal kenalog; VMT vitreomacular traction; MH Macular hole;  NVD neovascularization of the disc; NVE neovascularization elsewhere; AREDS age related eye disease study; ARMD age related macular degeneration; POAG primary open angle glaucoma; EBMD epithelial/anterior basement membrane dystrophy; ACIOL anterior chamber intraocular lens; IOL intraocular lens; PCIOL posterior chamber intraocular lens; Phaco/IOL phacoemulsification with intraocular lens placement; [place] photorefractive keratectomy; LASIK laser assisted in situ keratomileusis; HTN hypertension; DM diabetes mellitus; COPD chronic obstructive pulmonary disease

## 2021-06-13 NOTE — Assessment & Plan Note (Signed)
No sign of recurrent CNVM OS

## 2021-07-13 DIAGNOSIS — R0602 Shortness of breath: Secondary | ICD-10-CM | POA: Diagnosis not present

## 2021-07-13 DIAGNOSIS — R69 Illness, unspecified: Secondary | ICD-10-CM | POA: Diagnosis not present

## 2021-07-13 DIAGNOSIS — R000 Tachycardia, unspecified: Secondary | ICD-10-CM | POA: Diagnosis not present

## 2021-07-13 DIAGNOSIS — G4700 Insomnia, unspecified: Secondary | ICD-10-CM | POA: Diagnosis not present

## 2021-11-04 DIAGNOSIS — Z6827 Body mass index (BMI) 27.0-27.9, adult: Secondary | ICD-10-CM | POA: Diagnosis not present

## 2021-11-04 DIAGNOSIS — Z809 Family history of malignant neoplasm, unspecified: Secondary | ICD-10-CM | POA: Diagnosis not present

## 2021-11-04 DIAGNOSIS — R030 Elevated blood-pressure reading, without diagnosis of hypertension: Secondary | ICD-10-CM | POA: Diagnosis not present

## 2021-11-04 DIAGNOSIS — Z791 Long term (current) use of non-steroidal anti-inflammatories (NSAID): Secondary | ICD-10-CM | POA: Diagnosis not present

## 2021-11-04 DIAGNOSIS — R0902 Hypoxemia: Secondary | ICD-10-CM | POA: Diagnosis not present

## 2021-11-04 DIAGNOSIS — E663 Overweight: Secondary | ICD-10-CM | POA: Diagnosis not present

## 2021-11-04 DIAGNOSIS — Z85828 Personal history of other malignant neoplasm of skin: Secondary | ICD-10-CM | POA: Diagnosis not present

## 2021-11-04 DIAGNOSIS — J301 Allergic rhinitis due to pollen: Secondary | ICD-10-CM | POA: Diagnosis not present

## 2021-11-04 DIAGNOSIS — Z833 Family history of diabetes mellitus: Secondary | ICD-10-CM | POA: Diagnosis not present

## 2021-11-04 DIAGNOSIS — Z8249 Family history of ischemic heart disease and other diseases of the circulatory system: Secondary | ICD-10-CM | POA: Diagnosis not present

## 2021-11-04 DIAGNOSIS — R32 Unspecified urinary incontinence: Secondary | ICD-10-CM | POA: Diagnosis not present

## 2021-11-04 DIAGNOSIS — Z882 Allergy status to sulfonamides status: Secondary | ICD-10-CM | POA: Diagnosis not present

## 2021-12-21 ENCOUNTER — Other Ambulatory Visit: Payer: Self-pay | Admitting: Family Medicine

## 2021-12-21 DIAGNOSIS — Z1231 Encounter for screening mammogram for malignant neoplasm of breast: Secondary | ICD-10-CM

## 2022-01-09 ENCOUNTER — Ambulatory Visit
Admission: RE | Admit: 2022-01-09 | Discharge: 2022-01-09 | Disposition: A | Discharge status: Home / Self Care | Payer: Medicare HMO | Source: Ambulatory Visit | Attending: Family Medicine | Admitting: Family Medicine

## 2022-01-09 DIAGNOSIS — Z1231 Encounter for screening mammogram for malignant neoplasm of breast: Secondary | ICD-10-CM | POA: Diagnosis not present

## 2022-01-09 DIAGNOSIS — Z961 Presence of intraocular lens: Secondary | ICD-10-CM | POA: Diagnosis not present

## 2022-01-09 DIAGNOSIS — H524 Presbyopia: Secondary | ICD-10-CM | POA: Diagnosis not present

## 2022-01-09 DIAGNOSIS — H3530 Unspecified macular degeneration: Secondary | ICD-10-CM | POA: Diagnosis not present

## 2022-01-11 ENCOUNTER — Other Ambulatory Visit: Payer: Self-pay | Admitting: Family Medicine

## 2022-01-11 DIAGNOSIS — R928 Other abnormal and inconclusive findings on diagnostic imaging of breast: Secondary | ICD-10-CM

## 2022-01-29 ENCOUNTER — Ambulatory Visit
Admission: RE | Admit: 2022-01-29 | Discharge: 2022-01-29 | Disposition: A | Discharge status: Home / Self Care | Payer: Medicare HMO | Source: Ambulatory Visit | Attending: Family Medicine | Admitting: Family Medicine

## 2022-01-29 DIAGNOSIS — R922 Inconclusive mammogram: Secondary | ICD-10-CM | POA: Diagnosis not present

## 2022-01-29 DIAGNOSIS — R928 Other abnormal and inconclusive findings on diagnostic imaging of breast: Secondary | ICD-10-CM

## 2022-04-16 DIAGNOSIS — Z0001 Encounter for general adult medical examination with abnormal findings: Secondary | ICD-10-CM | POA: Diagnosis not present

## 2022-04-16 DIAGNOSIS — Z0000 Encounter for general adult medical examination without abnormal findings: Secondary | ICD-10-CM | POA: Diagnosis not present

## 2022-05-28 ENCOUNTER — Ambulatory Visit (INDEPENDENT_AMBULATORY_CARE_PROVIDER_SITE_OTHER): Payer: Medicare HMO | Admitting: Ophthalmology

## 2022-05-28 ENCOUNTER — Encounter (INDEPENDENT_AMBULATORY_CARE_PROVIDER_SITE_OTHER): Payer: Self-pay | Admitting: Ophthalmology

## 2022-05-28 DIAGNOSIS — H35712 Central serous chorioretinopathy, left eye: Secondary | ICD-10-CM | POA: Diagnosis not present

## 2022-05-28 DIAGNOSIS — H43811 Vitreous degeneration, right eye: Secondary | ICD-10-CM | POA: Diagnosis not present

## 2022-05-28 DIAGNOSIS — H353132 Nonexudative age-related macular degeneration, bilateral, intermediate dry stage: Secondary | ICD-10-CM

## 2022-05-28 DIAGNOSIS — H26491 Other secondary cataract, right eye: Secondary | ICD-10-CM | POA: Insufficient documentation

## 2022-05-28 DIAGNOSIS — H353222 Exudative age-related macular degeneration, left eye, with inactive choroidal neovascularization: Secondary | ICD-10-CM

## 2022-05-28 DIAGNOSIS — H353131 Nonexudative age-related macular degeneration, bilateral, early dry stage: Secondary | ICD-10-CM | POA: Diagnosis not present

## 2022-05-28 NOTE — Assessment & Plan Note (Signed)
No sign of recurrence 

## 2022-05-28 NOTE — Assessment & Plan Note (Signed)
ReturnPC opacification is present and accounts for visual symptoms. A YAG PC is recommended.  This procedure will maximize visual potential and allow enhanced medical monitoring of retinal conditions.  The risk and benefits were explained to the patient, including the risk of an increase in intraocular pressure temporarily after the procedure.    to see Dr. Rutherford Guys for consideration of YAG capsulotomy to alleviate her new symptoms of cloudy vision in the best, right eye

## 2022-05-28 NOTE — Progress Notes (Signed)
05/28/2022     CHIEF COMPLAINT Patient presents for  Chief Complaint  Patient presents with   Macular Degeneration      HISTORY OF PRESENT ILLNESS: Yesenia Sanchez is a 71 y.o. female who presents to the clinic today for:   HPI   With history of wet AMD OS in the past and intermediate OU.  11 mth fu ou OCT FP Pt states her vision has not been stable Pt denies any new floaters, Pt admits to FOL in her right eye Pt states sometimes she see lines in her right eye, they come and go. Pt states her vision has been fuzzy off and on Last edited by Hurman Horn, MD on 05/28/2022 10:07 AM.      Referring physician: Rutherford Guys, [place],  [place] [address]  HISTORICAL INFORMATION:   Selected notes from the [facility]: No current outpatient medications on file. (Ophthalmic Drugs)   No current facility-administered medications for this visit. (Ophthalmic Drugs)   Current Outpatient Medications (Other)  Medication Sig   Coenzyme Q10 (COQ10 PO) Take 1 tablet by mouth daily.   FLUoxetine (PROZAC) 10 MG capsule Take 10 mg by mouth 3 (three) times daily.   ibuprofen (ADVIL,MOTRIN) 200 MG tablet Take 400 mg by mouth every 6 (six) hours as needed for moderate pain.   simvastatin (ZOCOR) 10 MG tablet Take 10 mg by mouth daily.   No current facility-administered medications for this visit. (Other)      REVIEW OF SYSTEMS: ROS   Negative for: Constitutional, Gastrointestinal, Neurological, Skin, Genitourinary, Musculoskeletal, HENT, Endocrine, Cardiovascular, Eyes, Respiratory, Psychiatric, Allergic/Imm, Heme/Lymph Last edited by Orene Desanctis D, CMA on 05/28/2022  9:28 AM.       ALLERGIES Allergies  Allergen Reactions   Codeine Hives   Sulfa Antibiotics Hives    PAST MEDICAL HISTORY Past Medical History:  Diagnosis Date   Allergy    Anemia    in my 20's   Anxiety    Atypical nevus 07/16/2018   mid.  abdomen (moderate w/s)   BCC (basal cell carcinoma of skin) 07/16/2018   bcc sup. nod. cx3 54f   Cataract    right eye, left eye cateract was removed   Constipation    Fatigue    Hyperlipidemia    Hyperlipidemia    Insomnia    Night sweats    Urinary incontinence    Past Surgical History:  Procedure Laterality Date   CATARACT EXTRACTION W/PHACO Left 08/02/2015   Procedure: CATARACT EXTRACTION PHACO AND INTRAOCULAR LENS PLACEMENT LEFT EYE;  Surgeon: MRutherford Guys MD;  Location: [facility];  Service: Ophthalmology;  Laterality: Left;  CDE:4.01   MOUTH SURGERY     TUBAL LIGATION      FAMILY HISTORY Family History  Problem Relation Age of Onset   Heart disease Mother    Hypertension Father    Diabetes Father    Stroke Father    Hypertension Sister    Diabetes Sister    Colon polyps Sister    Colon cancer Neg Hx    Esophageal cancer Neg Hx    Rectal cancer Neg Hx    Stomach cancer Neg Hx     SOCIAL HISTORY Social History   Tobacco Use   Smoking status: Never   Smokeless tobacco: Never  Substance Use Topics   Alcohol use: No    Alcohol/week: 0.0 standard drinks of alcohol  Drug use: No         OPHTHALMIC EXAM:  Base Eye Exam     Visual Acuity (ETDRS)       Right Left   Dist cc 20/25 20/40 +2    Correction: Glasses         Tonometry (Tonopen, 9:33 AM)       Right Left   Pressure 13 14         Pupils       Pupils Shape APD   Right PERRL Round None   Left PERRL Round None         Visual Fields       Left Right    Full          Extraocular Movement       Right Left    Ortho Ortho    -- -- --  --  --  -- -- --   -- -- --  --  --  -- -- --           Neuro/Psych     Oriented x3: Yes   Mood/Affect: Normal         Dilation     Both eyes: 1.0% Mydriacyl, 2.5% Phenylephrine @ 9:29 AM           Slit Lamp and Fundus Exam     External Exam       Right Left   External Normal Normal         Slit Lamp Exam        Right Left   Lids/Lashes Normal Normal   Conjunctiva/Sclera White and quiet White and quiet   Cornea Clear Clear   Anterior Chamber Deep and quiet Deep and quiet   Iris Round and reactive Round and reactive   Lens Centered posterior chamber intraocular lens, 2+ Posterior capsular opacification Centered posterior chamber intraocular lens, Open posterior capsule   Anterior Vitreous Normal Normal         Fundus Exam       Right Left   Posterior Vitreous Posterior vitreous detachment, hazy view through capsule Vitrectomized   Disc Normal Normal   C/D Ratio 0.25 0.3   Macula Hard drusen, no hemorrhage, no exudates, no macular thickening Hard drusen, no hemorrhage, no exudates, no macular thickening, Retinal pigment epithelial mottling   Vessels Normal Normal   Periphery Normal Normal            IMAGING AND PROCEDURES  Imaging and Procedures for 05/28/22  OCT, Retina - OU - Both Eyes       Right Eye Quality was good. Scan locations included subfoveal. Central Foveal Thickness: 276. Progression has been stable. Findings include no SRF, abnormal foveal contour, retinal drusen .   Left Eye Quality was good. Scan locations included subfoveal. Central Foveal Thickness: 231. Progression has been stable. Findings include no SRF, retinal drusen , subretinal hyper-reflective material.   Notes No sign of CNVM OU     Color Fundus Photography Optos - OU - Both Eyes       Right Eye Progression has no prior data. Disc findings include normal observations. Macula : normal observations, drusen. Vessels : normal observations. Periphery : normal observations.   Left Eye Progression has no prior data. Disc findings include normal observations. Macula : drusen. Vessels : normal observations. Periphery : normal observations.   Notes No signs of active CNVM early geographic atrophy changes noted centrally left eye in the region of previous CNVM Not  active now              ASSESSMENT/PLAN:  Right posterior capsular opacification ReturnPC opacification is present and accounts for visual symptoms. A YAG PC is recommended.  This procedure will maximize visual potential and allow enhanced medical monitoring of retinal conditions.  The risk and benefits were explained to the patient, including the risk of an increase in intraocular pressure temporarily after the procedure.    to see Dr. 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 Early stage nonexudative age-related macular degeneration of both eyes  H35.3131 OCT, Retina - OU - Both Eyes    Color Fundus Photography Optos - OU - Both Eyes    2. Right posterior capsular opacification  H26.491     3. Exudative age-related macular degeneration of left eye with inactive choroidal neovascularization ([place])  H35.3222     4. Intermediate stage nonexudative age-related macular degeneration of both eyes  H35.3132     5. 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 Posterior vitreous detachment of right eye  H43.811       1.  Cloudy vision in the right eye is not from reactivation of CNVM in either eye.  Posterior capsule opacification present OD.  2.  I recommend follow-up Dr. Rutherford Guys for consideration of YAG capsulotomy right eye  3.  Ophthalmic Meds Ordered this visit:  No orders of the defined types were placed in this encounter.      Return in about 1 year (around 05/29/2023) for DILATE OU, OCT, COLOR  FP.  There are no Patient Instructions on file for this visit.   Explained the diagnoses, plan, and follow up with the patient and they expressed understanding.  Patient expressed understanding of the importance of proper follow up care.   Clent Demark Joana Nolton M.D. Diseases & Surgery of the Retina and Vitreous Retina & Diabetic [place] 05/28/22     Abbreviations: M myopia (nearsighted); A astigmatism; H hyperopia (farsighted); P presbyopia; Mrx spectacle prescription;  CTL contact lenses; OD right eye; OS left eye; OU both eyes  XT exotropia; ET esotropia; PEK punctate epithelial keratitis; PEE punctate epithelial erosions; DES dry eye syndrome; MGD meibomian gland dysfunction; ATs artificial tears; PFAT's preservative free artificial tears; [place] nuclear sclerotic cataract; PSC posterior subcapsular cataract; ERM epi-retinal membrane; PVD posterior vitreous detachment; RD retinal detachment; DM diabetes mellitus; DR diabetic retinopathy; NPDR non-proliferative diabetic retinopathy; PDR proliferative diabetic retinopathy; CSME clinically significant macular edema; DME diabetic macular edema; dbh dot blot hemorrhages; CWS cotton wool spot; POAG primary open angle glaucoma; C/D cup-to-disc ratio; HVF humphrey visual field; GVF goldmann visual field; OCT optical coherence tomography; IOP intraocular pressure; BRVO Branch retinal vein occlusion; CRVO central retinal vein occlusion; CRAO central retinal artery occlusion; BRAO branch retinal artery occlusion; RT retinal tear; SB scleral buckle; PPV pars plana vitrectomy; VH Vitreous hemorrhage; PRP panretinal laser photocoagulation; IVK intravitreal kenalog; VMT vitreomacular traction; MH Macular hole;  NVD neovascularization of the disc; NVE neovascularization elsewhere; AREDS age related eye disease study;  ARMD age related macular degeneration; POAG primary open angle glaucoma; EBMD epithelial/anterior basement membrane dystrophy; ACIOL anterior chamber  intraocular lens; IOL intraocular lens; PCIOL posterior chamber intraocular lens; Phaco/IOL phacoemulsification with intraocular lens placement; [place] photorefractive keratectomy; LASIK laser assisted in situ keratomileusis; HTN hypertension; DM diabetes mellitus; COPD chronic obstructive pulmonary disease

## 2022-05-28 NOTE — Assessment & Plan Note (Signed)
Physiologic OD observe

## 2022-05-28 NOTE — Assessment & Plan Note (Signed)
Stable OU no CNVM

## 2022-06-04 DIAGNOSIS — H26491 Other secondary cataract, right eye: Secondary | ICD-10-CM | POA: Diagnosis not present

## 2022-06-04 DIAGNOSIS — Z961 Presence of intraocular lens: Secondary | ICD-10-CM | POA: Diagnosis not present

## 2022-06-06 DIAGNOSIS — H26491 Other secondary cataract, right eye: Secondary | ICD-10-CM | POA: Diagnosis not present

## 2022-06-19 ENCOUNTER — Encounter (INDEPENDENT_AMBULATORY_CARE_PROVIDER_SITE_OTHER): Payer: Medicare HMO | Admitting: Ophthalmology

## 2022-07-23 DIAGNOSIS — L820 Inflamed seborrheic keratosis: Secondary | ICD-10-CM | POA: Diagnosis not present

## 2022-10-17 DIAGNOSIS — H16001 Unspecified corneal ulcer, right eye: Secondary | ICD-10-CM | POA: Diagnosis not present

## 2022-10-17 DIAGNOSIS — H53149 Visual discomfort, unspecified: Secondary | ICD-10-CM | POA: Diagnosis not present

## 2022-10-17 DIAGNOSIS — R6889 Other general symptoms and signs: Secondary | ICD-10-CM | POA: Diagnosis not present

## 2022-10-17 DIAGNOSIS — H5712 Ocular pain, left eye: Secondary | ICD-10-CM | POA: Diagnosis not present

## 2022-10-24 DIAGNOSIS — H16001 Unspecified corneal ulcer, right eye: Secondary | ICD-10-CM | POA: Diagnosis not present

## 2022-11-14 DIAGNOSIS — Z7689 Persons encountering health services in other specified circumstances: Secondary | ICD-10-CM | POA: Diagnosis not present

## 2022-11-14 DIAGNOSIS — E785 Hyperlipidemia, unspecified: Secondary | ICD-10-CM | POA: Diagnosis not present

## 2022-11-14 DIAGNOSIS — G4700 Insomnia, unspecified: Secondary | ICD-10-CM | POA: Diagnosis not present

## 2022-11-14 DIAGNOSIS — R5383 Other fatigue: Secondary | ICD-10-CM | POA: Diagnosis not present

## 2022-11-23 DIAGNOSIS — E785 Hyperlipidemia, unspecified: Secondary | ICD-10-CM | POA: Diagnosis not present

## 2022-11-23 DIAGNOSIS — R5383 Other fatigue: Secondary | ICD-10-CM | POA: Diagnosis not present

## 2022-11-28 DIAGNOSIS — G4700 Insomnia, unspecified: Secondary | ICD-10-CM | POA: Diagnosis not present

## 2022-11-28 DIAGNOSIS — Z2820 Immunization not carried out because of patient decision for unspecified reason: Secondary | ICD-10-CM | POA: Diagnosis not present

## 2022-11-28 DIAGNOSIS — E559 Vitamin D deficiency, unspecified: Secondary | ICD-10-CM | POA: Diagnosis not present

## 2022-11-28 DIAGNOSIS — E538 Deficiency of other specified B group vitamins: Secondary | ICD-10-CM | POA: Diagnosis not present

## 2022-11-28 DIAGNOSIS — R7303 Prediabetes: Secondary | ICD-10-CM | POA: Diagnosis not present

## 2022-11-28 DIAGNOSIS — R5383 Other fatigue: Secondary | ICD-10-CM | POA: Diagnosis not present

## 2022-11-28 DIAGNOSIS — E785 Hyperlipidemia, unspecified: Secondary | ICD-10-CM | POA: Diagnosis not present

## 2022-11-28 DIAGNOSIS — Z6828 Body mass index (BMI) 28.0-28.9, adult: Secondary | ICD-10-CM | POA: Diagnosis not present

## 2022-11-29 ENCOUNTER — Other Ambulatory Visit (HOSPITAL_COMMUNITY): Payer: Self-pay | Admitting: Family Medicine

## 2022-11-29 DIAGNOSIS — E785 Hyperlipidemia, unspecified: Secondary | ICD-10-CM

## 2023-01-14 ENCOUNTER — Ambulatory Visit (HOSPITAL_COMMUNITY)
Admission: RE | Admit: 2023-01-14 | Discharge: 2023-01-14 | Disposition: A | Discharge status: Home / Self Care | Payer: Medicare HMO | Source: Ambulatory Visit | Attending: Family Medicine | Admitting: Family Medicine

## 2023-01-14 DIAGNOSIS — E785 Hyperlipidemia, unspecified: Secondary | ICD-10-CM | POA: Insufficient documentation

## 2023-01-14 DIAGNOSIS — H524 Presbyopia: Secondary | ICD-10-CM | POA: Diagnosis not present

## 2023-01-16 ENCOUNTER — Other Ambulatory Visit: Payer: Self-pay | Admitting: Internal Medicine

## 2023-01-16 DIAGNOSIS — Z1231 Encounter for screening mammogram for malignant neoplasm of breast: Secondary | ICD-10-CM

## 2023-03-04 ENCOUNTER — Ambulatory Visit
Admission: RE | Admit: 2023-03-04 | Discharge: 2023-03-04 | Disposition: A | Discharge status: Home / Self Care | Payer: Medicare HMO | Source: Ambulatory Visit | Attending: Internal Medicine | Admitting: Internal Medicine

## 2023-03-04 DIAGNOSIS — Z1231 Encounter for screening mammogram for malignant neoplasm of breast: Secondary | ICD-10-CM

## 2023-03-09 DIAGNOSIS — E1122 Type 2 diabetes mellitus with diabetic chronic kidney disease: Secondary | ICD-10-CM | POA: Diagnosis not present

## 2023-03-09 DIAGNOSIS — Z8249 Family history of ischemic heart disease and other diseases of the circulatory system: Secondary | ICD-10-CM | POA: Diagnosis not present

## 2023-03-09 DIAGNOSIS — Z85828 Personal history of other malignant neoplasm of skin: Secondary | ICD-10-CM | POA: Diagnosis not present

## 2023-03-09 DIAGNOSIS — Z882 Allergy status to sulfonamides status: Secondary | ICD-10-CM | POA: Diagnosis not present

## 2023-03-09 DIAGNOSIS — Z87891 Personal history of nicotine dependence: Secondary | ICD-10-CM | POA: Diagnosis not present

## 2023-03-09 DIAGNOSIS — N182 Chronic kidney disease, stage 2 (mild): Secondary | ICD-10-CM | POA: Diagnosis not present

## 2023-03-09 DIAGNOSIS — E785 Hyperlipidemia, unspecified: Secondary | ICD-10-CM | POA: Diagnosis not present

## 2023-03-09 DIAGNOSIS — Z825 Family history of asthma and other chronic lower respiratory diseases: Secondary | ICD-10-CM | POA: Diagnosis not present

## 2023-03-09 DIAGNOSIS — H547 Unspecified visual loss: Secondary | ICD-10-CM | POA: Diagnosis not present

## 2023-03-09 DIAGNOSIS — I499 Cardiac arrhythmia, unspecified: Secondary | ICD-10-CM | POA: Diagnosis not present

## 2023-03-09 DIAGNOSIS — Z823 Family history of stroke: Secondary | ICD-10-CM | POA: Diagnosis not present

## 2023-03-09 DIAGNOSIS — R32 Unspecified urinary incontinence: Secondary | ICD-10-CM | POA: Diagnosis not present

## 2023-03-22 DIAGNOSIS — Z0000 Encounter for general adult medical examination without abnormal findings: Secondary | ICD-10-CM | POA: Diagnosis not present

## 2023-03-28 DIAGNOSIS — R5383 Other fatigue: Secondary | ICD-10-CM | POA: Diagnosis not present

## 2023-03-28 DIAGNOSIS — E785 Hyperlipidemia, unspecified: Secondary | ICD-10-CM | POA: Diagnosis not present

## 2023-04-03 DIAGNOSIS — G4700 Insomnia, unspecified: Secondary | ICD-10-CM | POA: Diagnosis not present

## 2023-04-03 DIAGNOSIS — Z6829 Body mass index (BMI) 29.0-29.9, adult: Secondary | ICD-10-CM | POA: Diagnosis not present

## 2023-04-03 DIAGNOSIS — R5383 Other fatigue: Secondary | ICD-10-CM | POA: Diagnosis not present

## 2023-04-03 DIAGNOSIS — E538 Deficiency of other specified B group vitamins: Secondary | ICD-10-CM | POA: Diagnosis not present

## 2023-04-03 DIAGNOSIS — E785 Hyperlipidemia, unspecified: Secondary | ICD-10-CM | POA: Diagnosis not present

## 2023-04-03 DIAGNOSIS — E663 Overweight: Secondary | ICD-10-CM | POA: Diagnosis not present

## 2023-04-03 DIAGNOSIS — R002 Palpitations: Secondary | ICD-10-CM | POA: Diagnosis not present

## 2023-04-03 DIAGNOSIS — E559 Vitamin D deficiency, unspecified: Secondary | ICD-10-CM | POA: Diagnosis not present

## 2023-04-03 DIAGNOSIS — R7303 Prediabetes: Secondary | ICD-10-CM | POA: Diagnosis not present

## 2023-04-03 DIAGNOSIS — Z23 Encounter for immunization: Secondary | ICD-10-CM | POA: Diagnosis not present

## 2023-04-14 DIAGNOSIS — G4730 Sleep apnea, unspecified: Secondary | ICD-10-CM | POA: Diagnosis not present

## 2023-05-29 ENCOUNTER — Encounter (INDEPENDENT_AMBULATORY_CARE_PROVIDER_SITE_OTHER): Payer: Self-pay

## 2023-05-29 ENCOUNTER — Encounter (INDEPENDENT_AMBULATORY_CARE_PROVIDER_SITE_OTHER): Payer: Medicare HMO | Admitting: Ophthalmology

## 2023-05-29 DIAGNOSIS — H43811 Vitreous degeneration, right eye: Secondary | ICD-10-CM | POA: Diagnosis not present

## 2023-05-29 DIAGNOSIS — H353222 Exudative age-related macular degeneration, left eye, with inactive choroidal neovascularization: Secondary | ICD-10-CM | POA: Diagnosis not present

## 2023-05-29 DIAGNOSIS — H353132 Nonexudative age-related macular degeneration, bilateral, intermediate dry stage: Secondary | ICD-10-CM | POA: Diagnosis not present

## 2023-06-28 DIAGNOSIS — E785 Hyperlipidemia, unspecified: Secondary | ICD-10-CM | POA: Diagnosis not present

## 2023-06-28 DIAGNOSIS — R5383 Other fatigue: Secondary | ICD-10-CM | POA: Diagnosis not present

## 2023-07-11 DIAGNOSIS — Z6828 Body mass index (BMI) 28.0-28.9, adult: Secondary | ICD-10-CM | POA: Diagnosis not present

## 2023-07-11 DIAGNOSIS — E663 Overweight: Secondary | ICD-10-CM | POA: Diagnosis not present

## 2023-07-11 DIAGNOSIS — G4700 Insomnia, unspecified: Secondary | ICD-10-CM | POA: Diagnosis not present

## 2023-07-11 DIAGNOSIS — R7303 Prediabetes: Secondary | ICD-10-CM | POA: Diagnosis not present

## 2023-07-11 DIAGNOSIS — R002 Palpitations: Secondary | ICD-10-CM | POA: Diagnosis not present

## 2023-07-11 DIAGNOSIS — E785 Hyperlipidemia, unspecified: Secondary | ICD-10-CM | POA: Diagnosis not present

## 2023-07-11 DIAGNOSIS — E559 Vitamin D deficiency, unspecified: Secondary | ICD-10-CM | POA: Diagnosis not present

## 2023-07-11 DIAGNOSIS — R5383 Other fatigue: Secondary | ICD-10-CM | POA: Diagnosis not present

## 2023-07-11 DIAGNOSIS — E538 Deficiency of other specified B group vitamins: Secondary | ICD-10-CM | POA: Diagnosis not present

## 2023-07-11 DIAGNOSIS — Z713 Dietary counseling and surveillance: Secondary | ICD-10-CM | POA: Diagnosis not present

## 2023-08-03 ENCOUNTER — Ambulatory Visit
Admission: RE | Admit: 2023-08-03 | Discharge: 2023-08-03 | Disposition: A | Discharge status: Home / Self Care | Payer: Medicare HMO | Source: Ambulatory Visit

## 2023-08-03 VITALS — BP 165/92 | HR 107 | Temp 98.4°F | Resp 18

## 2023-08-03 DIAGNOSIS — J0100 Acute maxillary sinusitis, unspecified: Secondary | ICD-10-CM

## 2023-08-03 MED ORDER — FLUTICASONE PROPIONATE 50 MCG/ACT NA SUSP: 2.0000 | Freq: Every day | NASAL | 0 refills | Status: DC

## 2023-08-03 MED ORDER — AMOXICILLIN-POT CLAVULANATE 875-125 MG PO TABS: 1.0000 | ORAL_TABLET | Freq: Two times a day (BID) | ORAL | 0 refills | Status: DC

## 2023-08-03 NOTE — ED Provider Notes (Signed)
[facility]    CSN: [id number] Arrival date & time: 08/03/23  1238      History   Chief Complaint Chief Complaint  Patient presents with   Cough    Sinus infection - Entered by patient    HPI CORAL TIMME is a 72 y.o. female.   The history is provided by the patient.   Patient presents for complaints of headache, nasal congestion, postnasal drainage, facial pressure, tooth pain, and cough that is been present for the past several days.  Patient denies fever, chills, ear drainage, ear pain, wheezing, difficulty breathing, chest pain, abdominal pain, nausea, vomiting, diarrhea, or rash.  Patient reports underlying history of seasonal allergies.  Reports she has been taking Mucinex and allergy medication along with zinc with minimal relief.  Patient reports she has not performed a COVID test at home.  Past Medical History:  Diagnosis Date   Allergy    Anemia    in my 20's   Anxiety    Atypical nevus 07/16/2018   mid. abdomen (moderate w/s)   BCC (basal cell carcinoma of skin) 07/16/2018   bcc sup. nod. 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, left 06/08/2020   Posterior vitreous detachment of right eye 06/08/2020   Vitreomacular adhesion of left eye 06/08/2020   Degenerative retinal drusen of left eye 06/08/2020   Degenerative retinal drusen of right eye 06/08/2020   History of vitrectomy 06/08/2020   ANXIETY 08/17/2010   PALPITATIONS 08/17/2010     Past Surgical History:  Procedure Laterality Date   CATARACT EXTRACTION W/PHACO Left 08/02/2015   Procedure: CATARACT EXTRACTION PHACO AND INTRAOCULAR LENS PLACEMENT LEFT EYE;  Surgeon: Jethro Bolus, MD;  Location: [facility];  Service: Ophthalmology;  Laterality: Left;  CDE:4.01   MOUTH SURGERY     TUBAL LIGATION      OB History   No obstetric history on file.      Home Medications    Prior to Admission medications   Medication Sig Start Date End Date Taking? Authorizing Provider  amoxicillin-clavulanate (AUGMENTIN) 875-125 MG tablet Take 1 tablet by mouth every 12 (twelve) hours. 08/03/23  Yes Dyonna Jaspers-Warren, Sadie Haber, NP  fluticasone (FLONASE) 50 MCG/ACT nasal spray Place 2 sprays into both nostrils daily. 08/03/23  Yes Saintclair Schroader-Warren, Sadie Haber, NP  pravastatin (PRAVACHOL) 10 MG tablet Take 10 mg by mouth daily.   Yes [provider]    Family History Family History  Problem Relation Age of Onset   Heart disease Mother    Hypertension Father    Diabetes Father    Stroke Father    Hypertension Sister    Diabetes Sister    Colon polyps Sister    Colon cancer Neg Hx    Esophageal cancer Neg Hx    Rectal cancer Neg Hx    Stomach cancer Neg Hx     Social History Social History   Tobacco Use   Smoking status: Never  Smokeless tobacco: Never  Substance Use Topics   Alcohol use: No    Alcohol/week: 0.0 standard drinks of alcohol   Drug use: No     Allergies   Codeine and Sulfa antibiotics   Review of Systems Review of Systems Per HPI  Physical Exam Triage Vital Signs ED Triage Vitals [08/03/23 1245]  Encounter Vitals Group     BP (!) 165/92     Systolic BP Percentile      Diastolic BP Percentile      Pulse Rate (!) 107     Resp 18     Temp 98.4 F (36.9 C)     Temp Source Oral     SpO2 94 %     Weight      Height      Head Circumference      Peak Flow      Pain Score 3     Pain Loc      Pain Education      Exclude from Growth  Chart    No data found.  Updated Vital Signs BP (!) 165/92 (BP Location: Right Arm)   Pulse (!) 107   Temp 98.4 F (36.9 C) (Oral)   Resp 18   SpO2 94%   Visual Acuity Right Eye Distance:   Left Eye Distance:   Bilateral Distance:    Right Eye Near:   Left Eye Near:    Bilateral Near:     Physical Exam Vitals and nursing note reviewed.  Constitutional:      General: She is not in acute distress.    Appearance: Normal appearance.  HENT:     Head: Normocephalic.     Right Ear: Tympanic membrane, ear canal and external ear normal.     Left Ear: Tympanic membrane, ear canal and external ear normal.     Nose: Congestion present.     Right Turbinates: Enlarged and swollen.     Left Turbinates: Enlarged and swollen.     Right Sinus: Maxillary sinus tenderness present. No frontal sinus tenderness.     Left Sinus: No maxillary sinus tenderness or frontal sinus tenderness.     Mouth/Throat:     Lips: Pink.     Mouth: Mucous membranes are moist.     Pharynx: Uvula midline. Posterior oropharyngeal erythema and postnasal drip present. No pharyngeal swelling, oropharyngeal exudate or uvula swelling.     Comments: Cobblestoning present to posterior oropharynx  Eyes:     Extraocular Movements: Extraocular movements intact.     Pupils: Pupils are equal, round, and reactive to light.  Cardiovascular:     Pulses: Normal pulses.     Heart sounds: Normal heart sounds.  Pulmonary:     Effort: Pulmonary effort is normal.     Breath sounds: Normal breath sounds.  Abdominal:     General: Bowel sounds are normal.     Palpations: Abdomen is soft.  Musculoskeletal:     Cervical back: Normal range of motion.  Skin:    General: Skin is warm and dry.  Neurological:     General: No focal deficit present.     Mental Status: She is alert and oriented to person, place, and time.  Psychiatric:        Mood and Affect: Mood normal.        Behavior: Behavior normal.      UC Treatments /  Results  Labs (all labs ordered are listed, but only abnormal results are displayed) Labs Reviewed -  No data to display  EKG   Radiology No results found.  Procedures Procedures (including critical care time)  Medications Ordered in UC Medications - No data to display  Initial Impression / Assessment and Plan / UC Course  I have reviewed the triage vital signs and the nursing notes.  Pertinent labs & imaging results that were available during my care of the patient were reviewed by me and considered in my medical decision making (see chart for details).  The patient is well-appearing, she is in no acute distress, vital signs are stable.  Will treat patient for an acute maxillary sinusitis.  Declines COVID test.  Patient presents with right maxillary sinus tenderness, and facial and tooth pain to the corresponding area.  Augmentin 875/125 mg along with fluticasone 50 mcg nasal spray was prescribed.  Patient advised to begin use of Coricidin HBP or continue Mucinex plain for her cough.  Supportive care recommendations were provided and discussed with the patient to include increasing fluids, allowing for plenty of rest, use of over-the-counter analgesics such as Tylenol, normal saline nasal spray, and use of a humidifier in her bedroom during sleep.  Patient was advised if symptoms do not improve with this treatment, it is recommended that she follow-up with her primary care physician for further evaluation.  Patient is in agreement with this plan of care and verbalizes understanding.  All questions were answered.  Patient stable for discharge.   Final Clinical Impressions(s) / UC Diagnoses   Final diagnoses:  Acute maxillary sinusitis, recurrence not specified     Discharge Instructions      Take medication as directed. Recommend over-the-counter Coricidin HBP or Mucinex (plain) for your cough. Continue your current allergy medication regimen. Increase fluids and get plenty of  rest. May take over-the-counter Tylenol as needed for pain, fever, or general discomfort. Recommend normal saline nasal spray to help with nasal congestion throughout the day. For your cough, it may be helpful to use a humidifier at bedtime during sleep and sleep elevated while symptoms persist. If symptoms do not improve with this treatment, please follow-up with your primary care physician for further evaluation. Follow-up as needed.     ED Prescriptions     Medication Sig Dispense Auth. Provider   amoxicillin-clavulanate (AUGMENTIN) 875-125 MG tablet Take 1 tablet by mouth every 12 (twelve) hours. 14 tablet Arieana Somoza-Warren, Sadie Haber, NP   fluticasone (FLONASE) 50 MCG/ACT nasal spray Place 2 sprays into both nostrils daily. 16 g Niyah Mamaril-Warren, Sadie Haber, NP      PDMP not reviewed this encounter.   Abran Cantor, NP 08/03/23 1319

## 2023-08-03 NOTE — ED Triage Notes (Signed)
Cough, nasal congestion and headache since Tuesday.  Has been taking mucinex and allergy mediation without relief

## 2023-08-03 NOTE — Discharge Instructions (Addendum)
Take medication as directed. Recommend over-the-counter Coricidin HBP or Mucinex (plain) for your cough. Continue your current allergy medication regimen. Increase fluids and get plenty of rest. May take over-the-counter Tylenol as needed for pain, fever, or general discomfort. Recommend normal saline nasal spray to help with nasal congestion throughout the day. For your cough, it may be helpful to use a humidifier at bedtime during sleep and sleep elevated while symptoms persist. If symptoms do not improve with this treatment, please follow-up with your primary care physician for further evaluation. Follow-up as needed.

## 2023-08-16 ENCOUNTER — Encounter: Payer: Self-pay | Admitting: Internal Medicine

## 2023-08-16 ENCOUNTER — Ambulatory Visit: Payer: Medicare HMO | Attending: Internal Medicine | Admitting: Internal Medicine

## 2023-08-16 VITALS — BP 158/90 | HR 64 | Ht 65.0 in | Wt 173.0 lb

## 2023-08-16 DIAGNOSIS — E7849 Other hyperlipidemia: Secondary | ICD-10-CM | POA: Diagnosis not present

## 2023-08-16 DIAGNOSIS — R002 Palpitations: Secondary | ICD-10-CM

## 2023-08-16 DIAGNOSIS — E785 Hyperlipidemia, unspecified: Secondary | ICD-10-CM | POA: Insufficient documentation

## 2023-08-16 MED ORDER — DILTIAZEM HCL 60 MG PO TABS: 60.0000 mg | ORAL_TABLET | Freq: Three times a day (TID) | ORAL | 3 refills | Status: AC | PRN

## 2023-08-16 NOTE — Patient Instructions (Signed)
Medication Instructions:   Start Diltiazem 60 mg Three Times Daily as needed for palpitations   *If you need a refill on your cardiac medications before your next appointment, please call your pharmacy*   Lab Work: NONE   If you have labs (blood work) drawn today and your tests are completely normal, you will receive your results only by: MyChart Message (if you have MyChart) OR A paper copy in the mail If you have any lab test that is abnormal or we need to change your treatment, we will call you to review the results.   Testing/Procedures: NONE    Follow-Up: At [facility], you and your health needs are our priority.  As part of our continuing mission to provide you with exceptional heart care, we have created designated Provider Care Teams.  These Care Teams include your primary Cardiologist (physician) and Advanced Practice Providers (APPs -  Physician Assistants and Nurse Practitioners) who all work together to provide you with the care you need, when you need it.  We recommend signing up for the patient portal called "MyChart".  Sign up information is provided on this After Visit Summary.  MyChart is used to connect with patients for Virtual Visits (Telemedicine).  Patients are able to view lab/test results, encounter notes, upcoming appointments, etc.  Non-urgent messages can be sent to your provider as well.   To learn more about what you can do with MyChart, go to [email or web address].    Your next appointment:   1 year(s)  Provider:   You may see Vishnu Norton Pastel, MD or the following Advanced Practice Provider on your designated Care Team:   Sharlene Dory, NP    Other Instructions Thank you for choosing [facility]!

## 2023-08-16 NOTE — Progress Notes (Signed)
Cardiology Office Note  Date: 08/16/2023   ID: Shawnessy, Maxham 1951/03/03, MRN [id number]  PCP:  Benita Stabile, MD  Cardiologist:  Marjo Bicker, MD Electrophysiologist:  None   History of Present Illness: Yesenia Sanchez is a 72 y.o. female known to have HLD was referred to cardiology clinic for evaluation of palpitations.   Ongoing palpitations for the last 1 year, sporadic, occurs once in a month or once in 2 months.  Last for hours.  Gradual onset and gradual tapering off.  No dizziness, presyncope, syncope.  No angina, DOE, orthopnea, PND.  No prior history of CAD, no prior ischemia evaluation.  Past Medical History:  Diagnosis Date   Allergy    Anemia    in my 20's   Anxiety    Atypical nevus 07/16/2018   mid. abdomen (moderate w/s)   BCC (basal cell carcinoma of skin) 07/16/2018   bcc sup. nod. cx3 41fu   Cataract    right eye, left eye cateract was removed   Constipation    Fatigue    Hyperlipidemia    Hyperlipidemia    Insomnia    Night sweats    Urinary incontinence     Past Surgical History:  Procedure Laterality Date   CATARACT EXTRACTION W/PHACO Left 08/02/2015   Procedure: CATARACT EXTRACTION PHACO AND INTRAOCULAR LENS PLACEMENT LEFT EYE;  Surgeon: Jethro Bolus, MD;  Location: [facility];  Service: Ophthalmology;  Laterality: Left;  CDE:4.01   MOUTH SURGERY     TUBAL LIGATION      Current Outpatient Medications  Medication Sig Dispense Refill   diltiazem (CARDIZEM) 60 MG tablet Take 1 tablet (60 mg total) by mouth 3 (three) times daily as needed (Palpitations). 270 tablet 3   Multiple Vitamins-Minerals (PRESERVISION AREDS) CAPS Take by mouth daily.     pravastatin (PRAVACHOL) 10 MG tablet Take 10 mg by mouth daily.     No current facility-administered medications for this visit.   Allergies:  Codeine and Sulfa antibiotics   Social History: The patient  reports that she has never smoked. She has never used smokeless tobacco. She reports  that she does not drink alcohol and does not use drugs.   Family History: The patient's family history includes Colon polyps in her sister; Diabetes in her father and sister; Heart disease in her mother; Hypertension in her father and sister; Stroke in her father.   ROS:  Please see the history of present illness. Otherwise, complete review of systems is positive for none  All other systems are reviewed and negative.   Physical Exam: VS:  BP (!) 158/90 (BP Location: Right Arm, Cuff Size: Normal)   Pulse 64   Ht 5\' 5"  (1.651 m)   Wt 173 lb (78.5 kg)   SpO2 99%   BMI 28.79 kg/m , BMI Body mass index is 28.79 kg/m.  Wt Readings from Last 3 Encounters:  08/16/23 173 lb (78.5 kg)  03/14/16 172 lb (78 kg)  08/04/15 165 lb (74.8 kg)    General: Patient appears comfortable at rest. HEENT: Conjunctiva and lids normal, oropharynx clear with moist mucosa. Neck: Supple, no elevated JVP or carotid bruits, no thyromegaly. Lungs: Clear to auscultation, nonlabored breathing at rest. Cardiac: Regular rate and rhythm, no S3 or significant systolic murmur, no pericardial rub. Abdomen: Soft, nontender, no hepatomegaly, bowel sounds present, no guarding or rebound. Extremities: No pitting edema, distal pulses 2+. Skin: Warm and dry. Musculoskeletal: No kyphosis. Neuropsychiatric: Alert and oriented  x3, affect grossly appropriate.  Recent Labwork: No results found for requested labs within last 365 days.  No results found for: "CHOL", "TRIG", "HDL", "CHOLHDL", "VLDL", "LDLCALC", "LDLDIRECT"    Assessment and Plan:  Palpitations: Sporadic, occurs once a month or once in 2 months.  Last for hours.  Gradual onset and gradual tapering off.  Unlikely SVT.  Has a lot of anxiety. Will prescribe diltiazem 60 mg every 8 hours as needed for palpitations.  Drinks coffee occasionally/none.  No indication of event monitor at this time.  HLD, unknown values: Continue pravastatin 10 mg nightly, goal LDL less  than 100.  Follows with PCP.   I spent a total duration of 30 minutes during the prior labs, EKG, notes, face-to-face discussion/counseling of her medical condition, pathophysiology, evaluation, management, ordering medications and documenting the findings in the note.    Medication Adjustments/Labs and Tests Ordered: Current medicines are reviewed at length with the patient today.  Concerns regarding medicines are outlined above.    Disposition:  Follow up  1 year  Signed Deziray Nabi Verne Spurr, MD, 08/16/2023 4:24 PM    [facility] [address] [place], [place], [place] [address]

## 2023-12-31 DIAGNOSIS — H35362 Drusen (degenerative) of macula, left eye: Secondary | ICD-10-CM | POA: Diagnosis not present

## 2023-12-31 DIAGNOSIS — H43811 Vitreous degeneration, right eye: Secondary | ICD-10-CM | POA: Diagnosis not present

## 2023-12-31 DIAGNOSIS — H35361 Drusen (degenerative) of macula, right eye: Secondary | ICD-10-CM | POA: Diagnosis not present

## 2023-12-31 DIAGNOSIS — H353132 Nonexudative age-related macular degeneration, bilateral, intermediate dry stage: Secondary | ICD-10-CM | POA: Diagnosis not present

## 2023-12-31 DIAGNOSIS — H353222 Exudative age-related macular degeneration, left eye, with inactive choroidal neovascularization: Secondary | ICD-10-CM | POA: Diagnosis not present

## 2024-01-14 DIAGNOSIS — R002 Palpitations: Secondary | ICD-10-CM | POA: Diagnosis not present

## 2024-01-14 DIAGNOSIS — R5383 Other fatigue: Secondary | ICD-10-CM | POA: Diagnosis not present

## 2024-01-14 DIAGNOSIS — E785 Hyperlipidemia, unspecified: Secondary | ICD-10-CM | POA: Diagnosis not present

## 2024-01-14 DIAGNOSIS — Z713 Dietary counseling and surveillance: Secondary | ICD-10-CM | POA: Diagnosis not present

## 2024-01-14 DIAGNOSIS — E538 Deficiency of other specified B group vitamins: Secondary | ICD-10-CM | POA: Diagnosis not present

## 2024-01-14 DIAGNOSIS — R7303 Prediabetes: Secondary | ICD-10-CM | POA: Diagnosis not present

## 2024-01-14 DIAGNOSIS — G4700 Insomnia, unspecified: Secondary | ICD-10-CM | POA: Diagnosis not present

## 2024-01-14 DIAGNOSIS — Z2820 Immunization not carried out because of patient decision for unspecified reason: Secondary | ICD-10-CM | POA: Diagnosis not present

## 2024-01-14 DIAGNOSIS — E559 Vitamin D deficiency, unspecified: Secondary | ICD-10-CM | POA: Diagnosis not present

## 2024-01-14 DIAGNOSIS — Z7182 Exercise counseling: Secondary | ICD-10-CM | POA: Diagnosis not present

## 2024-01-14 DIAGNOSIS — Z6828 Body mass index (BMI) 28.0-28.9, adult: Secondary | ICD-10-CM | POA: Diagnosis not present

## 2024-02-17 ENCOUNTER — Other Ambulatory Visit: Payer: Self-pay | Admitting: Internal Medicine

## 2024-02-17 DIAGNOSIS — Z1231 Encounter for screening mammogram for malignant neoplasm of breast: Secondary | ICD-10-CM

## 2024-02-26 ENCOUNTER — Encounter (HOSPITAL_COMMUNITY): Payer: Self-pay

## 2024-03-04 ENCOUNTER — Ambulatory Visit
Admission: RE | Admit: 2024-03-04 | Discharge: 2024-03-04 | Disposition: A | Discharge status: Home / Self Care | Source: Ambulatory Visit | Attending: Internal Medicine | Admitting: Internal Medicine

## 2024-03-04 DIAGNOSIS — Z1231 Encounter for screening mammogram for malignant neoplasm of breast: Secondary | ICD-10-CM | POA: Diagnosis not present

## 2024-07-06 DIAGNOSIS — Z1283 Encounter for screening for malignant neoplasm of skin: Secondary | ICD-10-CM | POA: Diagnosis not present

## 2024-07-06 DIAGNOSIS — Z08 Encounter for follow-up examination after completed treatment for malignant neoplasm: Secondary | ICD-10-CM | POA: Diagnosis not present

## 2024-07-06 DIAGNOSIS — Z85828 Personal history of other malignant neoplasm of skin: Secondary | ICD-10-CM | POA: Diagnosis not present

## 2024-07-06 DIAGNOSIS — D485 Neoplasm of uncertain behavior of skin: Secondary | ICD-10-CM | POA: Diagnosis not present

## 2024-07-06 DIAGNOSIS — D2261 Melanocytic nevi of right upper limb, including shoulder: Secondary | ICD-10-CM | POA: Diagnosis not present

## 2024-07-06 DIAGNOSIS — D225 Melanocytic nevi of trunk: Secondary | ICD-10-CM | POA: Diagnosis not present

## 2024-07-06 DIAGNOSIS — L570 Actinic keratosis: Secondary | ICD-10-CM | POA: Diagnosis not present

## 2024-07-06 DIAGNOSIS — X32XXXD Exposure to sunlight, subsequent encounter: Secondary | ICD-10-CM | POA: Diagnosis not present

## 2024-07-16 DIAGNOSIS — E559 Vitamin D deficiency, unspecified: Secondary | ICD-10-CM | POA: Diagnosis not present

## 2024-07-16 DIAGNOSIS — G4700 Insomnia, unspecified: Secondary | ICD-10-CM | POA: Diagnosis not present

## 2024-07-16 DIAGNOSIS — E785 Hyperlipidemia, unspecified: Secondary | ICD-10-CM | POA: Diagnosis not present

## 2024-07-16 DIAGNOSIS — Z6828 Body mass index (BMI) 28.0-28.9, adult: Secondary | ICD-10-CM | POA: Diagnosis not present

## 2024-07-16 DIAGNOSIS — E538 Deficiency of other specified B group vitamins: Secondary | ICD-10-CM | POA: Diagnosis not present

## 2024-07-16 DIAGNOSIS — E663 Overweight: Secondary | ICD-10-CM | POA: Diagnosis not present

## 2024-07-16 DIAGNOSIS — R5383 Other fatigue: Secondary | ICD-10-CM | POA: Diagnosis not present

## 2024-07-16 DIAGNOSIS — R7303 Prediabetes: Secondary | ICD-10-CM | POA: Diagnosis not present

## 2024-08-13 DIAGNOSIS — Z008 Encounter for other general examination: Secondary | ICD-10-CM | POA: Diagnosis not present

## 2024-09-23 DIAGNOSIS — H43811 Vitreous degeneration, right eye: Secondary | ICD-10-CM | POA: Diagnosis not present

## 2024-09-23 DIAGNOSIS — H35361 Drusen (degenerative) of macula, right eye: Secondary | ICD-10-CM | POA: Diagnosis not present

## 2024-09-23 DIAGNOSIS — H353132 Nonexudative age-related macular degeneration, bilateral, intermediate dry stage: Secondary | ICD-10-CM | POA: Diagnosis not present

## 2024-09-23 DIAGNOSIS — H35362 Drusen (degenerative) of macula, left eye: Secondary | ICD-10-CM | POA: Diagnosis not present

## 2024-09-23 DIAGNOSIS — H353222 Exudative age-related macular degeneration, left eye, with inactive choroidal neovascularization: Secondary | ICD-10-CM | POA: Diagnosis not present
